# Patient Record
Sex: Male | Born: 1955 | Race: White | Hispanic: No | Smoking: Current every day smoker
Health system: Southern US, Community
[De-identification: ages and names within clinical notes are randomized; demographics above are authoritative.]

## PROBLEM LIST (undated history)

## (undated) DIAGNOSIS — K635 Polyp of colon: Secondary | ICD-10-CM

## (undated) DIAGNOSIS — Z720 Tobacco use: Secondary | ICD-10-CM

## (undated) HISTORY — DX: Tobacco use: Z72.0

## (undated) HISTORY — DX: Polyp of colon: K63.5

## (undated) HISTORY — PX: TONSILLECTOMY: SUR1361

---
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
## 1981-12-16 HISTORY — PX: VASECTOMY: SHX75

## 2012-09-16 ENCOUNTER — Encounter: Payer: Self-pay | Admitting: Family Medicine

## 2012-09-16 ENCOUNTER — Ambulatory Visit (INDEPENDENT_AMBULATORY_CARE_PROVIDER_SITE_OTHER): Payer: 59 | Admitting: Family Medicine

## 2012-09-16 VITALS — BP 150/90 | HR 72 | Temp 97.9°F | Ht 69.0 in | Wt 164.0 lb

## 2012-09-16 DIAGNOSIS — Z23 Encounter for immunization: Secondary | ICD-10-CM

## 2012-09-16 DIAGNOSIS — Z720 Tobacco use: Secondary | ICD-10-CM | POA: Insufficient documentation

## 2012-09-16 DIAGNOSIS — F17200 Nicotine dependence, unspecified, uncomplicated: Secondary | ICD-10-CM

## 2012-09-16 DIAGNOSIS — Z125 Encounter for screening for malignant neoplasm of prostate: Secondary | ICD-10-CM

## 2012-09-16 DIAGNOSIS — Z136 Encounter for screening for cardiovascular disorders: Secondary | ICD-10-CM

## 2012-09-16 DIAGNOSIS — R079 Chest pain, unspecified: Secondary | ICD-10-CM

## 2012-09-16 DIAGNOSIS — I10 Essential (primary) hypertension: Secondary | ICD-10-CM | POA: Insufficient documentation

## 2012-09-16 LAB — COMPREHENSIVE METABOLIC PANEL
ALT: 21 U/L (ref 0–53)
AST: 23 U/L (ref 0–37)
Albumin: 4.3 g/dL (ref 3.5–5.2)
Calcium: 9.5 mg/dL (ref 8.4–10.5)
Chloride: 105 mEq/L (ref 96–112)
Potassium: 3.9 mEq/L (ref 3.5–5.1)
Sodium: 140 mEq/L (ref 135–145)

## 2012-09-16 LAB — LIPID PANEL
HDL: 40.2 mg/dL (ref >39.00)
Total CHOL/HDL Ratio: 4

## 2012-09-16 LAB — PSA: PSA: 2.95 ng/mL (ref 0.10–4.00)

## 2012-09-16 MED ORDER — LISINOPRIL 10 MG PO TABS: 10.0000 mg | ORAL_TABLET | Freq: Every day | ORAL | Status: DC

## 2012-09-16 NOTE — Patient Instructions (Addendum)
It was so nice to meet you. We are starting lisinopril 10 mg daily. Please come see me in 2-3 weeks for a complete physical and follow up blood pressure.  We will call you with your lab results.

## 2012-09-16 NOTE — Addendum Note (Signed)
Addended by: Eliezer Bottom on: 09/16/2012 12:40 PM   Modules accepted: Orders

## 2012-09-16 NOTE — Progress Notes (Signed)
Subjective:    Patient ID: Derek Flowers, male    DOB: 1956-09-25, 56 y.o.   MRN: [id number]  HPI  56 yo male with h/o tobacco abuse here to establish care with chest pain.  Has not been to a physician since before he moved here from [place] 3 years ago.  Kennyth Arnold and Alona Bene Lease are his parents.  His father did have an MI in his 36s.  3 days ago, Mr. Kozar noticed at work that he was having left sided chest pain.  Pain has been intermittent and does not seem to get better or worse with exertion.  He has not noticed any gassiness, nausea, vomiting or diaphoresis.  He has been taking ASA daily since this started. No SOB or palpitations.  He is a smoker- smokes 1 ppd, sometimes more depending on stress level at work.  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  HTN- BP is elevated today and has been on his parents home BP cuff and at the dentist office, all readings in 150s/90s.  Never been treated for HTN.  Patient Active Problem List  Diagnosis  . Tobacco abuse  . HTN (hypertension)  . Chest pain   Past Medical History  Diagnosis Date  . Family history of early CAD   . Tobacco abuse    Past Surgical History  Procedure Date  . Tonsillectomy    History  Substance Use Topics  . Smoking status: Current Every Day Smoker  . Smokeless tobacco: Not on file  . Alcohol Use: Not on file   Family History  Problem Relation Age of Onset  . Cancer Mother   . Kidney disease Mother   . Cancer Father   . Kidney disease Father   . Heart disease Father    Allergies  Allergen Reactions  . Penicillins     As a child   No current outpatient prescriptions on file prior to visit.   The PMH, PSH, Social History, Family History, Medications, and allergies have been reviewed in [facility], and have been updated if relevant.   Review of Systems See HPI No palpitations No anxiety or depression No changes in bowels Appetite good    Objective:   Physical Exam BP 150/90  Pulse 72  Temp 97.9 F (36.6 C)  Ht 5\' 9"  (1.753 m)  Wt 164 lb (74.39 kg)  BMI 24.22 kg/m2 General:  Very pleasant but anxious male in NAD Eyes:  PERRL Ears:  External ear exam shows no significant lesions or deformities.  Otoscopic examination reveals clear canals, tympanic membranes are intact bilaterally without bulging, retraction, inflammation or discharge. Hearing is grossly normal bilaterally. Nose:  External nasal examination shows no deformity or inflammation. Nasal mucosa are pink and moist without lesions or exudates. Mouth:  Oral mucosa and oropharynx without lesions or exudates.  Teeth in good repair. Neck:  no carotid bruit or thyromegaly no cervical or supraclavicular lymphadenopathy  Lungs:  Normal respiratory effort, chest expands symmetrically. Lungs are clear to auscultation, no crackles or wheezes. Heart:  Normal rate and regular rhythm. S1 and S2 normal without gallop, murmur, click, rub or other extra sounds. Abdomen:  Bowel sounds positive,abdomen soft and non-tender without masses, organomegaly or hernias noted.or tibial pulses are full and equal bilaterally  Extremities:  no edema      Assessment & Plan:   1. HTN (hypertension)  New diagnosis with multiple elevated readings per pt. Will start lisinopril 10 mg daily. Follow  up in 2-3 weeks. Check CMET today. The patient indicates understanding of these issues and agrees with the plan.  Comprehensive metabolic panel  2. Chest pain  New- atypical. EKG reassuring- mild bradycardia, otherwise normal. Likely muscle strain from lifting at work. Will continue to risk statify with lipid panel. The patient indicates understanding of these issues and agrees with the plan.  EKG 12-Lead

## 2012-09-28 ENCOUNTER — Ambulatory Visit (INDEPENDENT_AMBULATORY_CARE_PROVIDER_SITE_OTHER): Payer: 59 | Admitting: Family Medicine

## 2012-09-28 ENCOUNTER — Encounter: Payer: Self-pay | Admitting: Family Medicine

## 2012-09-28 ENCOUNTER — Encounter: Payer: 59 | Admitting: Family Medicine

## 2012-09-28 VITALS — BP 128/85 | HR 68 | Temp 98.1°F | Ht 69.0 in | Wt 163.0 lb

## 2012-09-28 DIAGNOSIS — Z1159 Encounter for screening for other viral diseases: Secondary | ICD-10-CM

## 2012-09-28 DIAGNOSIS — I10 Essential (primary) hypertension: Secondary | ICD-10-CM

## 2012-09-28 DIAGNOSIS — Z720 Tobacco use: Secondary | ICD-10-CM

## 2012-09-28 DIAGNOSIS — Z23 Encounter for immunization: Secondary | ICD-10-CM

## 2012-09-28 DIAGNOSIS — Z1211 Encounter for screening for malignant neoplasm of colon: Secondary | ICD-10-CM

## 2012-09-28 DIAGNOSIS — H6120 Impacted cerumen, unspecified ear: Secondary | ICD-10-CM

## 2012-09-28 DIAGNOSIS — Z0000 Encounter for general adult medical examination without abnormal findings: Secondary | ICD-10-CM

## 2012-09-28 DIAGNOSIS — L989 Disorder of the skin and subcutaneous tissue, unspecified: Secondary | ICD-10-CM

## 2012-09-28 DIAGNOSIS — F17200 Nicotine dependence, unspecified, uncomplicated: Secondary | ICD-10-CM

## 2012-09-28 NOTE — Addendum Note (Signed)
Addended by: Eliezer Bottom on: 09/28/2012 11:26 AM   Modules accepted: Orders

## 2012-09-28 NOTE — Progress Notes (Signed)
Subjective:    Patient ID: Derek Flowers, male    DOB: March 30, 1956, 56 y.o.   MRN: [id number]  HPI  56 yo male here for CPX.  Established care with me on 10/2.    Had never been treated for HTB but BP was elevated and had been at home as well.   Started Lisinopril 10 mg daily. Denies any side effects. No CP or SOB.  Lab Results  Component Value Date   CHOL 143 09/16/2012   HDL 40.20 09/16/2012   LDLCALC 86 09/16/2012   TRIG 85.0 09/16/2012   CHOLHDL 4 09/16/2012   Lab Results  Component Value Date   CREATININE 1.0 09/16/2012    He is a smoker- smokes 1 ppd, sometimes more depending on stress level at work.  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  He did have a colonoscopy at 56 yo.  Denies any blood in his stool or changes in his bowel habits. No known family history of colon cancer.  Cerumen impaction- feels like right ear is stopped up. No ear pain or significant hearing loss.  Patient Active Problem List  Diagnosis  . Tobacco abuse  . HTN (hypertension)  . Chest pain  . Routine general medical examination at a health care facility   Past Medical History  Diagnosis Date  . Family history of early CAD   . Tobacco abuse    Past Surgical History  Procedure Date  . Tonsillectomy    History  Substance Use Topics  . Smoking status: Current Every Day Smoker  . Smokeless tobacco: Not on file  . Alcohol Use: Not on file   Family History  Problem Relation Age of Onset  . Cancer Mother   . Kidney disease Mother   . Cancer Father   . Kidney disease Father   . Heart disease Father    Allergies  Allergen Reactions  . Penicillins     As a child   Current Outpatient Prescriptions on File Prior to Visit  Medication Sig Dispense Refill  . aspirin 325 MG tablet Take 325 mg by mouth daily.      Marland Kitchen lisinopril (PRINIVIL,ZESTRIL) 10 MG tablet Take 1 tablet (10 mg total) by mouth daily.  90 tablet  3  . Multiple Vitamin  (MULTIVITAMIN) tablet Take 1 tablet by mouth daily.      . naproxen sodium (ANAPROX) 220 MG tablet Take 220 mg by mouth 2 (two) times daily with a meal.       The PMH, PSH, Social History, Family History, Medications, and allergies have been reviewed in [facility], and have been updated if relevant.   Review of Systems See HPI Patient reports no  vision/ hearing changes,anorexia, weight change, fever ,adenopathy, persistant / recurrent hoarseness, swallowing issues, chest pain, edema,persistant / recurrent cough, hemoptysis, dyspnea(rest, exertional, paroxysmal nocturnal), gastrointestinal  bleeding (melena, rectal bleeding), abdominal pain, excessive heart burn, GU symptoms(dysuria, hematuria, pyuria, voiding/incontinence  Issues) syncope, focal weakness, severe memory loss, concerning skin lesions, depression, anxiety, abnormal bruising/bleeding, major joint swelling.       Objective:   Physical Exam BP 128/85  Pulse 68  Temp 98.1 F (36.7 C)  Ht 5\' 9"  (1.753 m)  Wt 163 lb (73.936 kg)  BMI 24.07 kg/m2 General:  Very pleasant but anxious male in NAD Eyes:  PERRL Ears:  Ceruminosis is noted in right ear. Nose:  External nasal examination shows no deformity or inflammation. Nasal mucosa are pink and  moist without lesions or exudates. Mouth:  Oral mucosa and oropharynx without lesions or exudates.  Teeth in good repair. Neck:  no carotid bruit or thyromegaly no cervical or supraclavicular lymphadenopathy  Lungs:  Normal respiratory effort, chest expands symmetrically. Lungs are clear to auscultation, no crackles or wheezes. Heart:  Normal rate and regular rhythm. S1 and S2 normal without gallop, murmur, click, rub or other extra sounds. Abdomen:  Bowel sounds positive,abdomen soft and non-tender without masses, organomegaly or hernias noted.or tibial pulses are full and equal bilaterally  Extremities:  no edema  Skin:  Large pedunculated flesh color mass left inguinal area (probable skin tag),  right upper back subcutaneous, palpable mass (probable lipoma), Multiple AKS on legs and back.    Assessment & Plan:   1. Routine general medical examination at a health care facility  Reviewed preventive care protocols, scheduled due services, and updated immunizations Discussed nutrition, exercise, diet, and healthy lifestyle.  Will screen for hep c given previous tatoos. The patient indicates understanding of these issues and agrees with the plan.   2. Tobacco abuse  Not ready to quit.   3. HTN (hypertension)  Improved on current dose of Lisinopril.   4. Screening for colon cancer  Fecal occult blood, imunochemical  5. Skin lesions, generalized  Ambulatory referral to Dermatology  6. Cerumen impaction   Wax is removed by syringing and manual debridement. Instructions for home care to prevent wax buildup are given.

## 2012-09-28 NOTE — Patient Instructions (Addendum)
Great to see you. Please stop by to see Shirlee Limerick on your way out to set up your dermatology referral.  Health Maintenance, Males A healthy lifestyle and preventative care can promote health and wellness.  Maintain regular health, dental, and eye exams.  Eat a healthy diet. Foods like vegetables, fruits, whole grains, low-fat dairy products, and lean protein foods contain the nutrients you need without too many calories. Decrease your intake of foods high in solid fats, added sugars, and salt. Get information about a proper diet from your caregiver, if necessary.  Regular physical exercise is one of the most important things you can do for your health. Most adults should get at least 150 minutes of moderate-intensity exercise (any activity that increases your heart rate and causes you to sweat) each week. In addition, most adults need muscle-strengthening exercises on 2 or more days a week.   Maintain a healthy weight. The body mass index (BMI) is a screening tool to identify possible weight problems. It provides an estimate of body fat based on height and weight. Your caregiver can help determine your BMI, and can help you achieve or maintain a healthy weight. For adults 20 years and older:  A BMI below 18.5 is considered underweight.  A BMI of 18.5 to 24.9 is normal.  A BMI of 25 to 29.9 is considered overweight.  A BMI of 30 and above is considered obese.  Maintain normal blood lipids and cholesterol by exercising and minimizing your intake of saturated fat. Eat a balanced diet with plenty of fruits and vegetables. Blood tests for lipids and cholesterol should begin at age 93 and be repeated every 5 years. If your lipid or cholesterol levels are high, you are over 50, or you are a high risk for heart disease, you may need your cholesterol levels checked more frequently.Ongoing high lipid and cholesterol levels should be treated with medicines, if diet and exercise are not effective.  If you  smoke, find out from your caregiver how to quit. If you do not use tobacco, do not start.  If you choose to drink alcohol, do not exceed 2 drinks per day. One drink is considered to be 12 ounces (355 mL) of beer, 5 ounces (148 mL) of wine, or 1.5 ounces (44 mL) of liquor.  Avoid use of street drugs. Do not share needles with anyone. Ask for help if you need support or instructions about stopping the use of drugs.  High blood pressure causes heart disease and increases the risk of stroke. Blood pressure should be checked at least every 1 to 2 years. Ongoing high blood pressure should be treated with medicines if weight loss and exercise are not effective.  If you are 59 to 55 years old, ask your caregiver if you should take aspirin to prevent heart disease.  Diabetes screening involves taking a blood sample to check your fasting blood sugar level. This should be done once every 3 years, after age 69, if you are within normal weight and without risk factors for diabetes. Testing should be considered at a younger age or be carried out more frequently if you are overweight and have at least 1 risk factor for diabetes.  Colorectal cancer can be detected and often prevented. Most routine colorectal cancer screening begins at the age of 73 and continues through age 53. However, your caregiver may recommend screening at an earlier age if you have risk factors for colon cancer. On a yearly basis, your caregiver may provide home  test kits to check for hidden blood in the stool. Use of a small camera at the end of a tube, to directly examine the colon (sigmoidoscopy or colonoscopy), can detect the earliest forms of colorectal cancer. Talk to your caregiver about this at age 64, when routine screening begins. Direct examination of the colon should be repeated every 5 to 10 years through age 8, unless early forms of pre-cancerous polyps or small growths are found.  Hepatitis C blood testing is recommended for all  people born from [birth year] through 1965 and any individual with known risks for hepatitis C.  Healthy men should no longer receive prostate-specific antigen (PSA) blood tests as part of routine cancer screening. Consult with your caregiver about prostate cancer screening.  Testicular cancer screening is not recommended for adolescents or adult males who have no symptoms. Screening includes self-exam, caregiver exam, and other screening tests. Consult with your caregiver about any symptoms you have or any concerns you have about testicular cancer.  Practice safe sex. Use condoms and avoid high-risk sexual practices to reduce the spread of sexually transmitted infections (STIs).  Use sunscreen with a sun protection factor (SPF) of 30 or greater. Apply sunscreen liberally and repeatedly throughout the day. You should seek shade when your shadow is shorter than you. Protect yourself by wearing long sleeves, pants, a wide-brimmed hat, and sunglasses year round, whenever you are outdoors.  Notify your caregiver of new moles or changes in moles, especially if there is a change in shape or color. Also notify your caregiver if a mole is larger than the size of a pencil eraser.  A one-time screening for abdominal aortic aneurysm (AAA) and surgical repair of large AAAs by sound wave imaging (ultrasonography) is recommended for ages 20 to 36 years who are current or former smokers.  Stay current with your immunizations. Document Released: 05/30/2008 Document Revised: 02/24/2012 Document Reviewed: 04/29/2011 [facility] Patient Information 2013 [place], [place].

## 2012-09-29 LAB — HEPATITIS C ANTIBODY: HCV Ab: NEGATIVE

## 2012-10-05 ENCOUNTER — Telehealth: Payer: Self-pay | Admitting: Radiology

## 2012-10-05 ENCOUNTER — Encounter: Payer: Self-pay | Admitting: Family Medicine

## 2012-10-05 ENCOUNTER — Other Ambulatory Visit: Payer: 59

## 2012-10-05 LAB — FECAL OCCULT BLOOD, IMMUNOCHEMICAL: Fecal Occult Bld: POSITIVE

## 2012-10-05 NOTE — Telephone Encounter (Signed)
[facility] called a positive ifob.Results given to Dr Dayton Martes

## 2012-10-06 ENCOUNTER — Other Ambulatory Visit: Payer: Self-pay | Admitting: Family Medicine

## 2012-10-06 ENCOUNTER — Telehealth: Payer: Self-pay | Admitting: *Deleted

## 2012-10-06 DIAGNOSIS — R195 Other fecal abnormalities: Secondary | ICD-10-CM

## 2012-10-06 DIAGNOSIS — Z86010 Personal history of colonic polyps: Secondary | ICD-10-CM

## 2012-10-06 NOTE — Telephone Encounter (Signed)
See result note.  

## 2012-10-07 ENCOUNTER — Encounter: Payer: Self-pay | Admitting: Internal Medicine

## 2012-10-29 ENCOUNTER — Encounter: Payer: Self-pay | Admitting: Internal Medicine

## 2012-10-30 ENCOUNTER — Encounter: Payer: Self-pay | Admitting: Internal Medicine

## 2012-10-30 ENCOUNTER — Ambulatory Visit (INDEPENDENT_AMBULATORY_CARE_PROVIDER_SITE_OTHER): Payer: 59 | Admitting: Internal Medicine

## 2012-10-30 ENCOUNTER — Other Ambulatory Visit: Payer: Self-pay | Admitting: Gastroenterology

## 2012-10-30 VITALS — BP 144/84 | HR 66 | Ht 70.0 in | Wt 172.0 lb

## 2012-10-30 DIAGNOSIS — K1379 Other lesions of oral mucosa: Secondary | ICD-10-CM

## 2012-10-30 DIAGNOSIS — R195 Other fecal abnormalities: Secondary | ICD-10-CM

## 2012-10-30 DIAGNOSIS — K1370 Unspecified lesions of oral mucosa: Secondary | ICD-10-CM

## 2012-10-30 NOTE — Patient Instructions (Addendum)
You have been given a separate informational sheet regarding your tobacco use, the importance of quitting and local resources to help you quit.   You have been scheduled for a colonoscopy with propofol. Please follow written instructions given to you at your visit today.  Please pick up your prep kit at the pharmacy within the next 1-3 days. If you use inhalers (even only as needed) or a CPAP machine, please bring them with you on the day of your procedure.  

## 2012-10-30 NOTE — Progress Notes (Signed)
Patient ID: Derek Flowers, male   DOB: Jun 20, 1956, 56 y.o.   MRN: [id number]  SUBJECTIVE: HPI [name] is a 56 year old male with a past medical history of hypertension, tobacco use who seen in consultation at the request of Dr. Dayton Martes for positive FOBT.  The patient had screening colonoscopy at age 70 performed in [place], and recalls polyps been removed. He is unaware of the type of polyp or the recommended surveillance interval. Currently he is without GI complaint. He said no change in his bowel pattern, no abdominal pain, diarrhea or constipation. He does has a history of hemorrhoids, but they have not been an issue of late. No rectal bleeding or melena. No weight loss. Normal appetite and no weight loss. He does have a lesion on the inside of his left buccal mucosa which is nonhealing, but not painful. He reports his dentist saw the lesion prescribed a mouth rinse which did not help. He is a smoker long-standing, but has not used smokeless tobacco.  Review of Systems  As per history of present illness, otherwise negative   Past Medical History  Diagnosis Date  . Tobacco abuse     Current Outpatient Prescriptions  Medication Sig Dispense Refill  . aspirin 325 MG tablet Take 325 mg by mouth daily.      Marland Kitchen lisinopril (PRINIVIL,ZESTRIL) 10 MG tablet Take 1 tablet (10 mg total) by mouth daily.  90 tablet  3  . Multiple Vitamin (MULTIVITAMIN) tablet Take 1 tablet by mouth daily.      . naproxen sodium (ANAPROX) 220 MG tablet Take 220 mg by mouth 2 (two) times daily with a meal.        Allergies  Allergen Reactions  . Penicillins     As a child    Family History  Problem Relation Age of Onset  . Cancer Mother   . Kidney disease Mother   . Cancer Father   . Kidney disease Father   . Heart disease Father   . Colon polyps Father     History  Substance Use Topics  . Smoking status: Current Every Day Smoker -- 0.5 packs/day for 40 years    Types: Cigarettes  . Smokeless tobacco:  Never Used  . Alcohol Use: No    OBJECTIVE: BP 144/84  Pulse 66  Ht 5\' 10"  (1.778 m)  Wt 172 lb (78.019 kg)  BMI 24.68 kg/m2 Constitutional: Well-developed and well-nourished. No distress. HEENT: Normocephalic and atraumatic. Left buccal mucosa lesion with fissuring and whitish coloration. No oropharyngeal exudate. Conjunctivae are normal. No scleral icterus. Neck: Neck supple. Trachea midline. Cardiovascular: Normal rate, regular rhythm and intact distal pulses. No M/R/G Pulmonary/chest: Effort normal and breath sounds normal. No wheezing, rales or rhonchi. Abdominal: Soft, nontender, nondistended. Bowel sounds active throughout. There are no masses palpable. No hepatosplenomegaly. Extremities: no clubbing, cyanosis, or edema Lymphadenopathy: No cervical adenopathy noted. Neurological: Alert and oriented to person place and time. Skin: Skin is warm and dry. No rashes noted. Psychiatric: Normal mood and affect. Behavior is normal.  Labs CMP     Component Value Date/Time   NA 140 09/16/2012 1149   K 3.9 09/16/2012 1149   CL 105 09/16/2012 1149   CO2 28 09/16/2012 1149   GLUCOSE 95 09/16/2012 1149   BUN 16 09/16/2012 1149   CREATININE 1.0 09/16/2012 1149   CALCIUM 9.5 09/16/2012 1149   PROT 6.7 09/16/2012 1149   ALBUMIN 4.3 09/16/2012 1149   AST 23 09/16/2012 1149   ALT 21 09/16/2012  1149   ALKPHOS 77 09/16/2012 1149   BILITOT 1.1 09/16/2012 1149   +FOBT  ASSESSMENT AND PLAN: 56 year old male with a past medical history of hypertension, tobacco use who seen in consultation at the request of Dr. Dayton Martes for positive FOBT.  1.  + FOBT -- we will request the patient's colonoscopy records from his initial screening examination. Regardless of those results, given his positive FOBT I recommended repeat colonoscopy. We discussed the test and he is agreeable to proceed.  Surveillance interval we based on this examination and review of the records of his first colonoscopy..  2.  Buccal mucosal  lesion -- I think this deserves further investigation and will refer him to ENT for evaluation and consideration of biopsy if felt necessary (especially in light of his tobacco use)

## 2012-11-02 ENCOUNTER — Telehealth: Payer: Self-pay | Admitting: Gastroenterology

## 2012-11-02 NOTE — Telephone Encounter (Signed)
Called pt. lvm telling him he has been referred to ENT Dr. Annalee Genta at [facility] [phone] on 11/16/2012 @ 1:40, he is to arrive 30 min. Prior to his appointment bring a list of his medications and co-pay. I gave him the address and phone # in case he needed to reschedule.

## 2012-11-09 ENCOUNTER — Other Ambulatory Visit: Payer: Self-pay | Admitting: Gastroenterology

## 2012-11-09 MED ORDER — PEG-KCL-NACL-NASULF-NA ASC-C 100 G PO SOLR: 1.0000 | Freq: Once | ORAL | Status: DC

## 2012-11-10 ENCOUNTER — Ambulatory Visit: Payer: Self-pay | Admitting: Family Medicine

## 2012-11-10 NOTE — Telephone Encounter (Signed)
Opened in error

## 2012-11-15 DIAGNOSIS — K635 Polyp of colon: Secondary | ICD-10-CM

## 2012-11-15 HISTORY — DX: Polyp of colon: K63.5

## 2012-11-24 ENCOUNTER — Encounter: Payer: 59 | Admitting: Internal Medicine

## 2012-11-26 ENCOUNTER — Ambulatory Visit (AMBULATORY_SURGERY_CENTER): Payer: 59 | Admitting: Internal Medicine

## 2012-11-26 ENCOUNTER — Encounter: Payer: Self-pay | Admitting: Internal Medicine

## 2012-11-26 VITALS — BP 116/71 | HR 56 | Temp 97.1°F | Resp 26 | Ht 70.0 in | Wt 172.0 lb

## 2012-11-26 DIAGNOSIS — Z1211 Encounter for screening for malignant neoplasm of colon: Secondary | ICD-10-CM

## 2012-11-26 DIAGNOSIS — R195 Other fecal abnormalities: Secondary | ICD-10-CM

## 2012-11-26 DIAGNOSIS — Z86010 Personal history of colonic polyps: Secondary | ICD-10-CM

## 2012-11-26 DIAGNOSIS — D126 Benign neoplasm of colon, unspecified: Secondary | ICD-10-CM

## 2012-11-26 MED ORDER — SODIUM CHLORIDE 0.9 % IV SOLN: 500.0000 mL | INTRAVENOUS | Status: DC

## 2012-11-26 NOTE — Progress Notes (Signed)
Patient did not have preoperative order for IV antibiotic SSI prophylaxis. ([id number])   

## 2012-11-26 NOTE — Progress Notes (Signed)
Called to room to assist during endoscopic procedure.  Patient ID and intended procedure confirmed with present staff. Received instructions for my participation in the procedure from the performing physician. [email or web address] 

## 2012-11-26 NOTE — Patient Instructions (Addendum)
YOU HAD AN ENDOSCOPIC PROCEDURE TODAY AT THE [facility]: Refer to the procedure report that was given to you for any specific questions about what was found during the examination.  If the procedure report does not answer your questions, please call your gastroenterologist to clarify.  If you requested that your care partner not be given the details of your procedure findings, then the procedure report has been included in a sealed envelope for you to review at your convenience later.  YOU SHOULD EXPECT: Some feelings of bloating in the abdomen. Passage of more gas than usual.  Walking can help get rid of the air that was put into your GI tract during the procedure and reduce the bloating. If you had a lower endoscopy (such as a colonoscopy or flexible sigmoidoscopy) you may notice spotting of blood in your stool or on the toilet paper. If you underwent a bowel prep for your procedure, then you may not have a normal bowel movement for a few days.  DIET: Your first meal following the procedure should be a light meal and then it is ok to progress to your normal diet.  A half-sandwich or bowl of soup is an example of a good first meal.  Heavy or fried foods are harder to digest and may make you feel nauseous or bloated.  Likewise meals heavy in dairy and vegetables can cause extra gas to form and this can also increase the bloating.  Drink plenty of fluids but you should avoid alcoholic beverages for 24 hours.  ACTIVITY: Your care partner should take you home directly after the procedure.  You should plan to take it easy, moving slowly for the rest of the day.  You can resume normal activity the day after the procedure however you should NOT DRIVE or use heavy machinery for 24 hours (because of the sedation medicines used during the test).    SYMPTOMS TO REPORT IMMEDIATELY: A gastroenterologist can be reached at any hour.  During normal business hours, 8:30 AM to 5:00 PM Monday through Friday,  call [phone].  After hours and on weekends, please call the GI answering service at [phone] who will take a message and have the physician on call contact you.   Following lower endoscopy (colonoscopy or flexible sigmoidoscopy):  Excessive amounts of blood in the stool  Significant tenderness or worsening of abdominal pains  Swelling of the abdomen that is new, acute  Fever of 100F or higher  FOLLOW UP: If any biopsies were taken you will be contacted by phone or by letter within the next 1-3 weeks.  Call your gastroenterologist if you have not heard about the biopsies in 3 weeks.  Our staff will call the home number listed on your records the next business day following your procedure to check on you and address any questions or concerns that you may have at that time regarding the information given to you following your procedure. This is a courtesy call and so if there is no answer at the home number and we have not heard from you through the emergency physician on call, we will assume that you have returned to your regular daily activities without incident.  SIGNATURES/CONFIDENTIALITY: You and/or your care partner have signed paperwork which will be entered into your electronic medical record.  These signatures attest to the fact that that the information above on your After Visit Summary has been reviewed and is understood.  Full responsibility of the confidentiality of this   discharge information lies with you and/or your care-partner.   Thank-you for choosing us for your healthcare needs. 

## 2012-11-26 NOTE — Op Note (Signed)
[facility] [address] [place], [address]   COLONOSCOPY PROCEDURE REPORT  PATIENT: Derek, Flowers  MR#: [id number] BIRTHDATE: [birth year] , 56  yrs. old GENDER: Male ENDOSCOPIST: Beverley Fiedler, MD REFERRED XB:JYNW, Talia PROCEDURE DATE:  11/26/2012 PROCEDURE:   Colonoscopy with biopsy and Colonoscopy with cold biopsy polypectomy ASA CLASS:   Class II INDICATIONS:heme-positive stool. MEDICATIONS: MAC sedation, administered by CRNA and Propofol (Diprivan) 170 mg IV  DESCRIPTION OF PROCEDURE:   After the risks benefits and alternatives of the procedure were thoroughly explained, informed consent was obtained.  A digital rectal exam revealed no rectal mass.   The LB CF-[id number]  endoscope was introduced through the anus and advanced to the terminal ileum which was intubated for a short distance. No adverse events experienced.   The quality of the prep was good, using MoviPrep  The instrument was then slowly withdrawn as the colon was fully examined.   COLON FINDINGS: The mucosa appeared normal in the terminal ileum. Two sessile polyps measuring 2-3 mm in size were found in the sigmoid colon and rectum.  Polypectomy was performed with cold forceps.  All resections were complete and all polyp tissue was completely retrieved.   Mild patchy erythema was found in the sigmoid colon in the segment of diverticulosis.  Query preparation artifact verus very mild segment colitis associated with diverticulosis.  Multiple biopsies of the area were performed using cold forceps.   There was moderate diverticulosis noted in the descending colon and sigmoid colon with associated muscular hypertrophy.  Retroflexed views revealed small internal hemorrhoids. The time to cecum=4 minutes 50 seconds.  Withdrawal time=13 minutes 28 seconds.  The scope was withdrawn and the procedure completed. COMPLICATIONS: There were no complications.  ENDOSCOPIC IMPRESSION: 1.   Normal  mucosa in the terminal ileum 2.   Two sessile polyps measuring 2-3 mm in size were found in the sigmoid colon and rectum; Polypectomy was performed with cold forceps 3.   Mild erythema was found in the sigmoid colon; multiple biopsies of the area were performed using cold forceps 4.   There was moderate diverticulosis noted in the descending colon and sigmoid colon 5.   Small internal hemorrhoids  RECOMMENDATIONS: 1.  Await pathology results 2.  High fiber diet 3.  Timing of repeat colonoscopy will be determined by pathology findings. 4.  You will receive a letter within 1-2 weeks with the results of your biopsy as well as final recommendations.  Please call my office if you have not received a letter after 3 weeks.   eSigned:  Beverley Fiedler, MD 11/26/2012 2:36 PM   cc: Ruthe Mannan MD and The Patient   PATIENT NAME:  Derek, Flowers MR#: [id number]

## 2012-11-27 ENCOUNTER — Telehealth: Payer: Self-pay | Admitting: *Deleted

## 2012-11-27 NOTE — Telephone Encounter (Signed)
  Follow up Call-  Call back number 11/26/2012  Post procedure Call Back phone  # [phone]  Permission to leave phone message Yes     Patient questions:  Do you have a fever, pain , or abdominal swelling? no Pain Score  0 *  Have you tolerated food without any problems? yes  Have you been able to return to your normal activities? yes  Do you have any questions about your discharge instructions: Diet   no Medications  no Follow up visit  no  Do you have questions or concerns about your Care? no  Actions: * If pain score is 4 or above: No action needed, pain <4.

## 2012-12-02 ENCOUNTER — Encounter: Payer: Self-pay | Admitting: Internal Medicine

## 2012-12-30 ENCOUNTER — Encounter: Payer: Self-pay | Admitting: Family Medicine

## 2013-01-06 ENCOUNTER — Encounter: Payer: Self-pay | Admitting: Family Medicine

## 2013-01-06 ENCOUNTER — Ambulatory Visit (INDEPENDENT_AMBULATORY_CARE_PROVIDER_SITE_OTHER): Payer: 59 | Admitting: Family Medicine

## 2013-01-06 VITALS — BP 130/80 | HR 76 | Temp 98.0°F | Wt 173.0 lb

## 2013-01-06 DIAGNOSIS — N529 Male erectile dysfunction, unspecified: Secondary | ICD-10-CM | POA: Insufficient documentation

## 2013-01-06 MED ORDER — SILDENAFIL CITRATE 100 MG PO TABS: 50.0000 mg | ORAL_TABLET | Freq: Every day | ORAL | Status: AC | PRN

## 2013-01-06 NOTE — Progress Notes (Signed)
  Subjective:    Patient ID: Derek Flowers, male    DOB: 1956-09-26, 57 y.o.   MRN: [id number]  HPI 57 yo here to discuss tx options for erectile dysfunction.  He is able to get an erection on his own and not having any difficulty reaching orgasm. His girlfriend livers in [place] and he only sees her twice a year. He is seeing her at the end of this month and having anxiety about trouble performing when he does see her.  Has never taken anything for ED in past.  No CP or SOB.  Patient Active Problem List  Diagnosis  . Tobacco abuse  . HTN (hypertension)  . Erectile dysfunction   Past Medical History  Diagnosis Date  . Tobacco abuse    Past Surgical History  Procedure Date  . Tonsillectomy   . Hernia repair 1972  . Vasectomy 1983   History  Substance Use Topics  . Smoking status: Current Every Day Smoker -- 0.5 packs/day for 40 years    Types: Cigarettes  . Smokeless tobacco: Never Used  . Alcohol Use: No   Family History  Problem Relation Age of Onset  . Cancer Mother   . Kidney disease Mother   . Cancer Father   . Kidney disease Father   . Heart disease Father   . Colon polyps Father    Allergies  Allergen Reactions  . Penicillins     As a child   Current Outpatient Prescriptions on File Prior to Visit  Medication Sig Dispense Refill  . aspirin 325 MG tablet Take 325 mg by mouth daily.      Marland Kitchen lisinopril (PRINIVIL,ZESTRIL) 10 MG tablet Take 1 tablet (10 mg total) by mouth daily.  90 tablet  3  . Multiple Vitamin (MULTIVITAMIN) tablet Take 1 tablet by mouth daily.      . naproxen sodium (ANAPROX) 220 MG tablet Take 220 mg by mouth 2 (two) times daily with a meal.      . sildenafil (VIAGRA) 100 MG tablet Take 0.5-1 tablets (50-100 mg total) by mouth daily as needed for erectile dysfunction.  10 tablet  11   The PMH, PSH, Social History, Family History, Medications, and allergies have been reviewed in [facility], and have been updated if relevant.    Review of  Systems See HPI    Objective:   Physical Exam BP 130/80  Pulse 76  Temp 98 F (36.7 C)  Wt 173 lb (78.472 kg) Gen:  Alert pleasant, NAD Psych:  Good eye contact, not anxious or depressed appearing.    Assessment & Plan:   1. Erectile dysfunction    >15 min spent with face to face with patient, >50% counseling and/or coordinating care.  Given rx for viagra to use prn-discussed possible side effects and precautions including priapism. He will email me or call me with an update.

## 2013-01-06 NOTE — Patient Instructions (Addendum)
Great to see you. Have a wonderful time with your girlfriend.

## 2013-08-30 ENCOUNTER — Encounter: Payer: Self-pay | Admitting: Internal Medicine

## 2013-08-30 ENCOUNTER — Other Ambulatory Visit (INDEPENDENT_AMBULATORY_CARE_PROVIDER_SITE_OTHER): Payer: 59

## 2013-08-30 ENCOUNTER — Ambulatory Visit (INDEPENDENT_AMBULATORY_CARE_PROVIDER_SITE_OTHER): Payer: 59 | Admitting: Internal Medicine

## 2013-08-30 VITALS — BP 108/70 | HR 80 | Ht 70.0 in | Wt 163.2 lb

## 2013-08-30 DIAGNOSIS — R1030 Lower abdominal pain, unspecified: Secondary | ICD-10-CM

## 2013-08-30 DIAGNOSIS — R109 Unspecified abdominal pain: Secondary | ICD-10-CM

## 2013-08-30 DIAGNOSIS — R1032 Left lower quadrant pain: Secondary | ICD-10-CM

## 2013-08-30 DIAGNOSIS — Z860100 Personal history of colon polyps, unspecified: Secondary | ICD-10-CM | POA: Insufficient documentation

## 2013-08-30 DIAGNOSIS — N509 Disorder of male genital organs, unspecified: Secondary | ICD-10-CM

## 2013-08-30 LAB — CBC
HCT: 43.8 % (ref 39.0–52.0)
MCHC: 34.1 g/dL (ref 30.0–36.0)
MCV: 92.7 fl (ref 78.0–100.0)
RDW: 13.3 % (ref 11.5–14.6)

## 2013-08-30 LAB — COMPREHENSIVE METABOLIC PANEL
AST: 19 U/L (ref 0–37)
Albumin: 4.2 g/dL (ref 3.5–5.2)
Alkaline Phosphatase: 63 U/L (ref 39–117)
Glucose, Bld: 88 mg/dL (ref 70–99)
Potassium: 4.1 mEq/L (ref 3.5–5.1)
Sodium: 139 mEq/L (ref 135–145)
Total Bilirubin: 0.5 mg/dL (ref 0.3–1.2)
Total Protein: 7 g/dL (ref 6.0–8.3)

## 2013-08-30 LAB — URINALYSIS WITH CULTURE, IF INDICATED
Bilirubin Urine: NEGATIVE
Nitrite: NEGATIVE
Total Protein, Urine: NEGATIVE
Urine Glucose: NEGATIVE
WBC, UA: NONE SEEN (ref 0–?)
pH: 7.5 (ref 5.0–8.0)

## 2013-08-30 NOTE — Patient Instructions (Addendum)
Your physician has requested that you go to the basement for the following lab work before leaving today: CBC, CMET, U/A   You have been scheduled for a CT scan of the abdomen and pelvis at [facility] ([address]---this is in the same building as Architectural technologist).   You are scheduled on 08/31/2013 at 9:30pm. You should arrive 15 minutes prior to your appointment time for registration. Please follow the written instructions below on the day of your exam:  WARNING: IF YOU ARE ALLERGIC TO IODINE/X-RAY DYE, PLEASE NOTIFY RADIOLOGY IMMEDIATELY AT [phone]! YOU WILL BE GIVEN A 13 HOUR PREMEDICATION PREP.  1) Do not eat or drink anything after 11:00am (4 hours prior to your test) 2) You have been given 2 bottles of oral contrast to drink. The solution may taste better if refrigerated, but do NOT add ice or any other liquid to this solution. Shake well before drinking.    Drink 1 bottle of contrast @7 :30 (2 hours prior to your exam)  Drink 1 bottle of contrast @ 8:30 (1 hour prior to your exam)  You may take any medications as prescribed with a small amount of water except for the following: Metformin, Glucophage, Glucovance, Avandamet, Riomet, Fortamet, Actoplus Met, Janumet, Glumetza or Metaglip. The above medications must be held the day of the exam AND 48 hours after the exam.  The purpose of you drinking the oral contrast is to aid in the visualization of your intestinal tract. The contrast solution may cause some diarrhea. Before your exam is started, you will be given a small amount of fluid to drink. Depending on your individual set of symptoms, you may also receive an intravenous injection of x-ray contrast/dye. Plan on being at [facility] for 30 minutes or long, depending on the type of exam you are having performed.  If you have any questions regarding your exam or if you need to reschedule, you may call the CT department at [phone] between the hours of  8:00 am and 5:00 pm, Monday-Friday.  You have been referred to [facility]. You will be contacted by them within the next 24 hours If you do not hear from them you can call [phone]    ________________________________________________________________________

## 2013-08-30 NOTE — Progress Notes (Signed)
Subjective:    Patient ID: Derek Flowers, male    DOB: 02/27/1956, 57 y.o.   MRN: [id number]  HPI Derek Flowers is a 57 year old male with a past medical history of hypertension, tobacco use, colonic polyps and diverticulosis who seen in urgent visit for left lower abdominal pain.  He is here alone today.  The patient reports developing left lower quadrant abdominal pain and pressure radiating towards his left groin late Friday night and into Saturday (2-3 days ago).  He reports that he was doing particularly strenuous work on Friday and initially felt like maybe this was the source of his pain. He reports developing left lower quadrant abdominal pain which radiated towards his left groin and reached 8/10 in intensity.  He did not seek emergency care and the pain slowly improved but has not completely remitted. It is now approximately 1/10. He has noted some mild constipation, but no rectal bleeding or melena. Appetite has been good and he was able to eat a full meal last night including steak, salad and baked potato. He normally has one bowel movement daily but did not have a bowel movement on Saturday or Sunday. He took one dose of MiraLax over the weekend. He's noted some urinary hesitancy and urgency, but thinks this might have been going on longer than very recently. He did take some ibuprofen for this pain and he feels it may have helped. Good appetite, no nausea, vomiting. No fevers or chills. He had a normal bowel movement this morning.  He denies penile discharge.  No testicular or scrotum pain today.  He also reports a soft left testicular "mass" which has been present for some time but never completely evaluated. He denies previous urology consult.  Review of Systems As per history of present illness, otherwise negative  Current Medications, Allergies, Past Medical History, Past Surgical History, Family History and Social History were reviewed in [employer] record.      Objective:   Physical Exam BP 108/70  Pulse 80  Ht 5\' 10"  (1.778 m)  Wt 163 lb 4 oz (74.05 kg)  BMI 23.42 kg/m2 Constitutional: Well-developed and well-nourished. No distress. HEENT: Normocephalic and atraumatic. Oropharynx is clear and moist. No oropharyngeal exudate. Conjunctivae are normal.  No scleral icterus. Neck: Neck supple. Trachea midline. Cardiovascular: Normal rate, regular rhythm and intact distal pulses. No M/R/G Pulmonary/chest: Effort normal and breath sounds normal. No wheezing, rales or rhonchi. Abdominal: Soft, very mild left lower quadrant and suprapubic pain to deep palpation, nondistended. Bowel sounds active throughout. There are no masses palpable. No hepatosplenomegaly. Extremities: no clubbing, cyanosis, or edema Lymphadenopathy: No cervical adenopathy noted. Neurological: Alert and oriented to person place and time. Skin: Skin is warm and dry. No rashes noted. Psychiatric: Normal mood and affect. Behavior is normal.      Assessment & Plan:  57 year old male with a past medical history of hypertension, tobacco use, colonic polyps and diverticulosis who seen in urgent visit for left lower abdominal pain.   1.  LLQ abd pain radiating to right groin -- exact etiology of this pain is unclear. Differential includes renal stone, lumbar disc disease, muscle strain, hernia, and less likely diverticulitis. His exam today is much more benign than I would expect for diverticulitis going on 2-3 days.  He is also not had fevers and is eating full meals without exacerbating symptoms.  Had normal bowel movement this morning which is reassuring. I recommend CBC, CMP, and UA with culture today.  I also  will order a noncontrast CT scan of the abdomen and pelvis to rule out renal stone.  I will not start antibiotics at this time but have asked that he notify me should his pain worsen again or change in any way. He voices understanding.  2.  Testicular "mass" -- he reports that this  has been present for one to 2 years but never fully evaluated. I recommended referral to urology which he agrees with.

## 2013-08-31 ENCOUNTER — Ambulatory Visit (INDEPENDENT_AMBULATORY_CARE_PROVIDER_SITE_OTHER)
Admission: RE | Admit: 2013-08-31 | Discharge: 2013-08-31 | Disposition: A | Discharge status: Home / Self Care | Payer: 59 | Source: Ambulatory Visit | Attending: Internal Medicine | Admitting: Internal Medicine

## 2013-08-31 ENCOUNTER — Telehealth: Payer: Self-pay | Admitting: Gastroenterology

## 2013-08-31 DIAGNOSIS — R1030 Lower abdominal pain, unspecified: Secondary | ICD-10-CM

## 2013-08-31 DIAGNOSIS — R109 Unspecified abdominal pain: Secondary | ICD-10-CM

## 2013-08-31 DIAGNOSIS — R1032 Left lower quadrant pain: Secondary | ICD-10-CM

## 2013-08-31 NOTE — Telephone Encounter (Signed)
Message copied by Richardo Hanks on Tue Aug 31, 2013  9:27 AM ------      Message from: Beverley Fiedler      Created: Tue Aug 31, 2013  8:38 AM       Please let pt know CBC is normal, metabolic and liver panel normal      Urine shows some red blood cells, further raising suspicion of kidney stone (CT is pending).  This is also another reason for urology referral ------

## 2013-08-31 NOTE — Telephone Encounter (Signed)
Spoke to pt gave him lab results per Dr. Rhea Belton, pt said he is getting his CT now, and has already spoken to Urology and Asher Muir the triage nurse will be calling him back with an appointment.

## 2013-09-02 ENCOUNTER — Inpatient Hospital Stay: Admission: RE | Admit: 2013-09-02 | Payer: 59 | Source: Ambulatory Visit

## 2013-10-06 ENCOUNTER — Emergency Department: Payer: Self-pay | Admitting: Emergency Medicine

## 2013-10-06 LAB — CBC WITH DIFFERENTIAL/PLATELET
Eosinophil %: 1.3 %
Lymphocyte #: 1.7 10*3/uL (ref 1.0–3.6)
Lymphocyte %: 16.6 %
Monocyte %: 10 %
RBC: 4.72 10*6/uL (ref 4.40–5.90)
RDW: 12.9 % (ref 11.5–14.5)

## 2013-10-06 LAB — BASIC METABOLIC PANEL
Anion Gap: 4 — ABNORMAL LOW (ref 7–16)
Calcium, Total: 9.3 mg/dL (ref 8.5–10.1)
Chloride: 109 mmol/L — ABNORMAL HIGH (ref 98–107)
Co2: 26 mmol/L (ref 21–32)
EGFR (African American): >60
EGFR (Non-African Amer.): >60
Sodium: 139 mmol/L (ref 136–145)

## 2013-10-06 LAB — APTT: Activated PTT: 29.6 secs (ref 23.6–35.9)

## 2013-10-09 ENCOUNTER — Other Ambulatory Visit: Payer: Self-pay | Admitting: Family Medicine

## 2013-10-12 ENCOUNTER — Ambulatory Visit: Payer: Self-pay | Admitting: Orthopedic Surgery

## 2013-10-21 ENCOUNTER — Other Ambulatory Visit: Payer: Self-pay

## 2013-11-10 ENCOUNTER — Other Ambulatory Visit: Payer: Self-pay | Admitting: Family Medicine

## 2013-12-12 ENCOUNTER — Other Ambulatory Visit: Payer: Self-pay | Admitting: Family Medicine

## 2013-12-13 ENCOUNTER — Other Ambulatory Visit: Payer: Self-pay | Admitting: Internal Medicine

## 2013-12-13 DIAGNOSIS — Z0000 Encounter for general adult medical examination without abnormal findings: Secondary | ICD-10-CM

## 2013-12-20 ENCOUNTER — Other Ambulatory Visit (INDEPENDENT_AMBULATORY_CARE_PROVIDER_SITE_OTHER): Payer: 59

## 2013-12-20 DIAGNOSIS — Z0000 Encounter for general adult medical examination without abnormal findings: Secondary | ICD-10-CM

## 2013-12-20 DIAGNOSIS — I10 Essential (primary) hypertension: Secondary | ICD-10-CM

## 2013-12-20 LAB — LIPID PANEL
CHOLESTEROL: 161 mg/dL (ref 0–200)
HDL: 37.3 mg/dL — ABNORMAL LOW (ref >39.00)
LDL Cholesterol: 98 mg/dL (ref 0–99)
Total CHOL/HDL Ratio: 4
Triglycerides: 131 mg/dL (ref 0.0–149.0)
VLDL: 26.2 mg/dL (ref 0.0–40.0)

## 2013-12-20 LAB — COMPREHENSIVE METABOLIC PANEL
ALBUMIN: 4.3 g/dL (ref 3.5–5.2)
ALT: 28 U/L (ref 0–53)
AST: 26 U/L (ref 0–37)
Alkaline Phosphatase: 67 U/L (ref 39–117)
BUN: 18 mg/dL (ref 6–23)
CALCIUM: 9.1 mg/dL (ref 8.4–10.5)
CHLORIDE: 105 meq/L (ref 96–112)
CO2: 27 meq/L (ref 19–32)
Creatinine, Ser: 1.1 mg/dL (ref 0.4–1.5)
GFR: 72.74 mL/min (ref >60.00)
GLUCOSE: 107 mg/dL — AB (ref 70–99)
Potassium: 4.2 mEq/L (ref 3.5–5.1)
SODIUM: 137 meq/L (ref 135–145)
TOTAL PROTEIN: 7 g/dL (ref 6.0–8.3)
Total Bilirubin: 0.6 mg/dL (ref 0.3–1.2)

## 2013-12-20 LAB — CBC
HEMATOCRIT: 46 % (ref 39.0–52.0)
HEMOGLOBIN: 15.4 g/dL (ref 13.0–17.0)
MCHC: 33.5 g/dL (ref 30.0–36.0)
MCV: 93.5 fl (ref 78.0–100.0)
Platelets: 226 10*3/uL (ref 150.0–400.0)
RBC: 4.92 Mil/uL (ref 4.22–5.81)
RDW: 13 % (ref 11.5–14.6)
WBC: 7.4 10*3/uL (ref 4.5–10.5)

## 2013-12-20 LAB — TSH: TSH: 4.03 u[IU]/mL (ref 0.35–5.50)

## 2013-12-27 ENCOUNTER — Encounter: Payer: Self-pay | Admitting: Family Medicine

## 2013-12-27 ENCOUNTER — Ambulatory Visit (INDEPENDENT_AMBULATORY_CARE_PROVIDER_SITE_OTHER): Payer: 59 | Admitting: Family Medicine

## 2013-12-27 VITALS — BP 142/82 | HR 123 | Temp 98.2°F | Ht 68.25 in | Wt 168.0 lb

## 2013-12-27 DIAGNOSIS — R39198 Other difficulties with micturition: Secondary | ICD-10-CM

## 2013-12-27 DIAGNOSIS — I10 Essential (primary) hypertension: Secondary | ICD-10-CM

## 2013-12-27 DIAGNOSIS — Z86010 Personal history of colonic polyps: Secondary | ICD-10-CM

## 2013-12-27 DIAGNOSIS — Z1211 Encounter for screening for malignant neoplasm of colon: Secondary | ICD-10-CM

## 2013-12-27 DIAGNOSIS — Z720 Tobacco use: Secondary | ICD-10-CM

## 2013-12-27 DIAGNOSIS — N529 Male erectile dysfunction, unspecified: Secondary | ICD-10-CM

## 2013-12-27 DIAGNOSIS — F17200 Nicotine dependence, unspecified, uncomplicated: Secondary | ICD-10-CM

## 2013-12-27 DIAGNOSIS — R3912 Poor urinary stream: Secondary | ICD-10-CM

## 2013-12-27 DIAGNOSIS — Z0000 Encounter for general adult medical examination without abnormal findings: Secondary | ICD-10-CM

## 2013-12-27 LAB — PSA: PSA: 3.93 ng/mL (ref 0.10–4.00)

## 2013-12-27 NOTE — Progress Notes (Signed)
Subjective:    Patient ID: Derek Flowers, male    DOB: 25-May-1956, 58 y.o.   MRN: [id number]  HPI  58 yo pleasant male here for CPX.  Unfortunately, his mother died a few months ago.  He has a very supportive family and he feels he is coping well.  He is very spiritual and feels she is in a better place.  Fractured his leg/ankle (left) in October 2014.  S/p surgical repair. Doing PT.  Ready to go back to work soon.  HTN-  Lisinopril 10 mg daily. Denies any side effects. No CP or SOB.  Has had a little more weak urinary stream and increased urinary frequency past 6 months or so.  Lab Results  Component Value Date   CHOL 161 12/20/2013   HDL 37.30* 12/20/2013   LDLCALC 98 12/20/2013   TRIG 131.0 12/20/2013   CHOLHDL 4 12/20/2013   Lab Results  Component Value Date   CREATININE 1.1 12/20/2013   Lab Results  Component Value Date   PSA 2.95 09/16/2012    He is a smoker- smokes 1 ppd, sometimes more depending on stress level at work.  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  Very active job- lifts heavy machinery, walks.   Patient Active Problem List   Diagnosis Date Noted  . Personal history of colonic polyps 08/30/2013  . Erectile dysfunction 01/06/2013  . HTN (hypertension) 09/16/2012  . Tobacco abuse    Past Medical History  Diagnosis Date  . Tobacco abuse   . Colon polyps 11/2012   Past Surgical History  Procedure Laterality Date  . Tonsillectomy    . Hernia repair  1972  . Vasectomy  1983   History  Substance Use Topics  . Smoking status: Current Every Day Smoker -- 0.50 packs/day for 40 years    Types: Cigarettes  . Smokeless tobacco: Never Used  . Alcohol Use: No   Family History  Problem Relation Age of Onset  . Cancer Mother   . Kidney disease Mother   . Cancer Father   . Kidney disease Father   . Heart disease Father   . Colon polyps Father    Allergies  Allergen Reactions  . Penicillins     As a child   Current Outpatient Prescriptions on File  Prior to Visit  Medication Sig Dispense Refill  . aspirin 325 MG tablet Take 325 mg by mouth daily.      Marland Kitchen lisinopril (PRINIVIL,ZESTRIL) 10 MG tablet TAKE 1 TABLET BY MOUTH EVERY DAY  90 tablet  0  . Multiple Vitamin (MULTIVITAMIN) tablet Take 1 tablet by mouth daily.      . naproxen sodium (ANAPROX) 220 MG tablet Take 220 mg by mouth 2 (two) times daily with a meal.      . Polyethylene Glycol 3350 (MIRALAX PO) Take by mouth. Has taken two doses.      . sildenafil (VIAGRA) 100 MG tablet Take 0.5-1 tablets (50-100 mg total) by mouth daily as needed for erectile dysfunction.  10 tablet  11   No current facility-administered medications on file prior to visit.   The PMH, PSH, Social History, Family History, Medications, and allergies have been reviewed in [facility], and have been updated if relevant.   Review of Systems See HPI Patient reports no  vision/ hearing changes,anorexia, weight change, fever ,adenopathy, persistant / recurrent hoarseness, swallowing issues, chest pain, edema,persistant / recurrent cough, hemoptysis, dyspnea(rest, exertional, paroxysmal nocturnal), gastrointestinal  bleeding (melena, rectal bleeding), abdominal pain, excessive  heart burn,  focal weakness, severe memory loss, concerning skin lesions, depression, anxiety, abnormal bruising/bleeding, major joint swelling.       Objective:   Physical Exam BP 142/82  Pulse 123  Temp(Src) 98.2 F (36.8 C) (Oral)  Ht 5' 8.25" (1.734 m)  Wt 168 lb (76.204 kg)  BMI 25.34 kg/m2  SpO2 97% General:  Very pleasant but anxious male in NAD Eyes:  PERRL Ears:  External ear exam shows no significant lesions or deformities.  Otoscopic examination reveals clear canals, tympanic membranes are intact bilaterally without bulging, retraction, inflammation or discharge. Hearing is grossly normal bilaterally. Nose:  External nasal examination shows no deformity or inflammation. Nasal mucosa are pink and moist without lesions or  exudates. Mouth:  Oral mucosa and oropharynx without lesions or exudates.  Teeth in good repair. Neck:  no carotid bruit or thyromegaly no cervical or supraclavicular lymphadenopathy  Lungs:  Normal respiratory effort, chest expands symmetrically. Lungs are clear to auscultation, no crackles or wheezes. Heart:  Normal rate and regular rhythm. S1 and S2 normal without gallop, murmur, click, rub or other extra sounds. Abdomen:  Bowel sounds positive,abdomen soft and non-tender without masses, organomegaly or hernias noted. Genitalia:  Testes bilaterally descended without nodularity, tenderness or masses. No scrotal masses or lesions. No penis lesions or urethral discharge. Prostate:  Prostate gland firm and smooth, 1 plus enlargement, no nodularity, tenderness, mass, asymmetry or induration. Pulses:  R and L posterior tibial pulses are full and equal bilaterally  Extremities:  no edema      Assessment & Plan:

## 2013-12-27 NOTE — Progress Notes (Signed)
Pre-visit discussion using our clinic review tool. No additional management support is needed unless otherwise documented below in the visit note.  

## 2013-12-27 NOTE — Assessment & Plan Note (Signed)
Reviewed preventive care protocols, scheduled due services, and updated immunizations Discussed nutrition, exercise, diet, and healthy lifestyle.  

## 2013-12-27 NOTE — Patient Instructions (Signed)
Good to see you. I will call you with your lab result from today.  Tamsulosin capsules What is this medicine? TAMSULOSIN (tam SOO loe sin) is used to treat enlargement of the prostate gland in men, a condition called benign prostatic hyperplasia or BPH. It is not for use in women. It works by relaxing muscles in the prostate and bladder neck. This improves urine flow and reduces BPH symptoms. This medicine may be used for other purposes; ask your health care provider or pharmacist if you have questions. COMMON BRAND NAME(S): Flomax What should I tell my health care provider before I take this medicine? They need to know if you have any of the following conditions: -advanced kidney disease -advanced liver disease -low blood pressure -prostate cancer -an unusual or allergic reaction to tamsulosin, sulfa drugs, other medicines, foods, dyes, or preservatives -pregnant or trying to get pregnant -breast-feeding How should I use this medicine? Take this medicine by mouth about 30 minutes after the same meal every day. Follow the directions on the prescription label. Swallow the capsules whole with a glass of water. Do not crush, chew, or open capsules. Do not take your medicine more often than directed. Do not stop taking your medicine unless your doctor tells you to. Talk to your pediatrician regarding the use of this medicine in children. Special care may be needed. Overdosage: If you think you have taken too much of this medicine contact a poison control center or emergency room at once. NOTE: This medicine is only for you. Do not share this medicine with others. What if I miss a dose? If you miss a dose, take it as soon as you can. If it is almost time for your next dose, take only that dose. Do not take double or extra doses. If you stop taking your medicine for several days or more, ask your doctor or health care professional what dose you should start back on. What may interact with this  medicine? -cimetidine -fluoxetine -ketoconazole -medicines for erectile disfunction like sildenafil, tadalafil, vardenafil -medicines for high blood pressure -other alpha-blockers like alfuzosin, doxazosin, phentolamine, phenoxybenzamine, prazosin, terazosin -warfarin This list may not describe all possible interactions. Give your health care provider a list of all the medicines, herbs, non-prescription drugs, or dietary supplements you use. Also tell them if you smoke, drink alcohol, or use illegal drugs. Some items may interact with your medicine. What should I watch for while using this medicine? Visit your doctor or health care professional for regular check ups. You will need lab work done before you start this medicine and regularly while you are taking it. Check your blood pressure as directed. Ask your health care professional what your blood pressure should be, and when you should contact him or her. This medicine may make you feel dizzy or lightheaded. This is more likely to happen after the first dose, after an increase in dose, or during hot weather or exercise. Drinking alcohol and taking some medicines can make this worse. Do not drive, use machinery, or do anything that needs mental alertness until you know how this medicine affects you. Do not sit or stand up quickly. If you begin to feel dizzy, sit down until you feel better. These effects can decrease once your body adjusts to the medicine. Contact your doctor or health care professional right away if you have an erection that lasts longer than 4 hours or if it becomes painful. This may be a sign of a serious problem and must  be treated right away to prevent permanent damage. If you are thinking of having cataract surgery, tell your eye surgeon that you have taken this medicine. What side effects may I notice from receiving this medicine? Side effects that you should report to your doctor or health care professional as soon as  possible: -allergic reactions like skin rash or itching, hives, swelling of the lips, mouth, tongue, or throat -breathing problems -change in vision -feeling faint or lightheaded -irregular heartbeat -prolonged or painful erection -weakness Side effects that usually do not require medical attention (report to your doctor or health care professional if they continue or are bothersome): -back pain -change in sex drive or performance -constipation, nausea or vomiting -cough -drowsy -runny or stuffy nose -trouble sleeping This list may not describe all possible side effects. Call your doctor for medical advice about side effects. You may report side effects to FDA at 1-800-FDA-1088. Where should I keep my medicine? Keep out of the reach of children. Store at room temperature between 15 and 30 degrees C (59 and 86 degrees F). Throw away any unused medicine after the expiration date. NOTE: This sheet is a summary. It may not cover all possible information. If you have questions about this medicine, talk to your doctor, pharmacist, or health care provider.  2014, Elsevier/Gold Standard. (2012-12-02 14:11:34)

## 2013-12-27 NOTE — Assessment & Plan Note (Signed)
Continue lisinopril

## 2013-12-27 NOTE — Assessment & Plan Note (Addendum)
PSA today No prostate nodules on exam Discussed flomax.

## 2013-12-28 ENCOUNTER — Other Ambulatory Visit: Payer: Self-pay | Admitting: Family Medicine

## 2013-12-28 ENCOUNTER — Telehealth: Payer: Self-pay | Admitting: Family Medicine

## 2013-12-28 MED ORDER — TAMSULOSIN HCL 0.4 MG PO CAPS: 0.4000 mg | ORAL_CAPSULE | Freq: Every day | ORAL | Status: DC

## 2013-12-28 NOTE — Telephone Encounter (Signed)
Relevant patient education assigned to patient using Emmi. ° °

## 2014-01-11 ENCOUNTER — Other Ambulatory Visit: Payer: Self-pay | Admitting: Internal Medicine

## 2014-02-22 ENCOUNTER — Other Ambulatory Visit: Payer: Self-pay | Admitting: Internal Medicine

## 2014-02-24 ENCOUNTER — Ambulatory Visit: Payer: Self-pay | Admitting: Orthopedic Surgery

## 2014-05-09 ENCOUNTER — Other Ambulatory Visit: Payer: Self-pay | Admitting: Family Medicine

## 2014-08-14 IMAGING — CT CT ABD-PELV W/O CM
2 of 4 series · 17 of 46 positions shown, 19 images · non-contrast
Comparison: None.

CLINICAL DATA: Left groin pain and microscopic hematuria

CT ABDOMEN AND PELVIS WITHOUT CONTRAST
TECHNIQUE: Multidetector CT imaging of the abdomen and pelvis was
performed following the standard protocol without intravenous
contrast.

[Series 2: 5mm stone study - 160 eff. mas · axial · 0.65mm/px · z∈[-490,-65]mm · 14 of 93 slices shown, 16 images]
[im 4/93  soft-tissue]
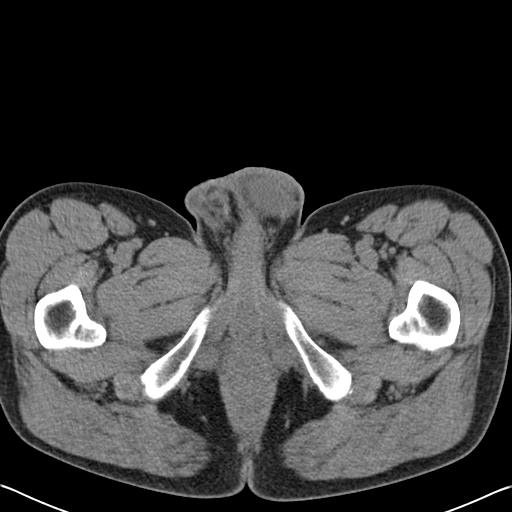
[im 4/93  bone]
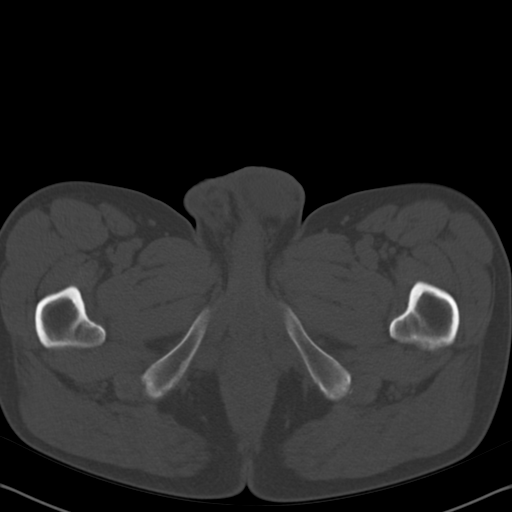
[im 12/93  soft-tissue]
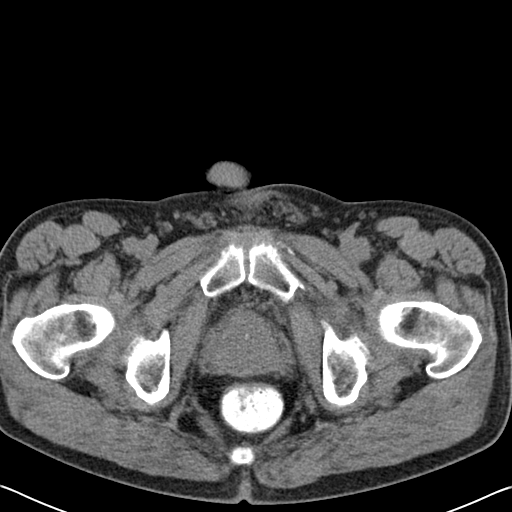
[im 19/93  soft-tissue]
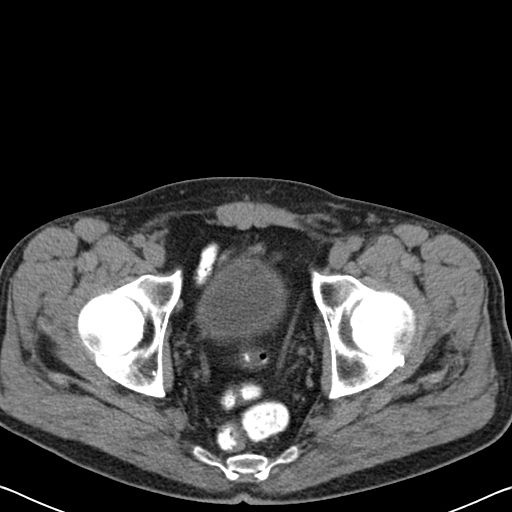
[im 26/93  soft-tissue]
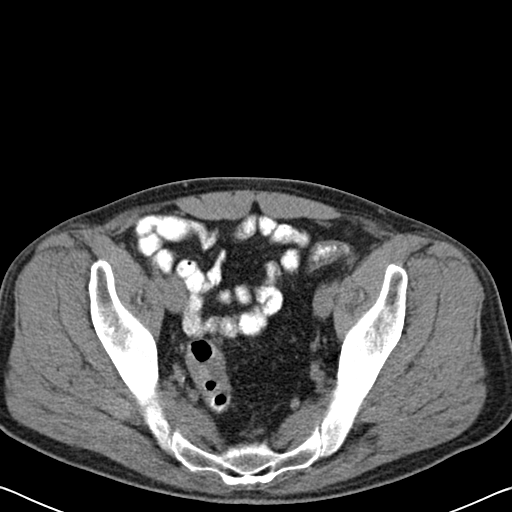
[im 30/93  soft-tissue]
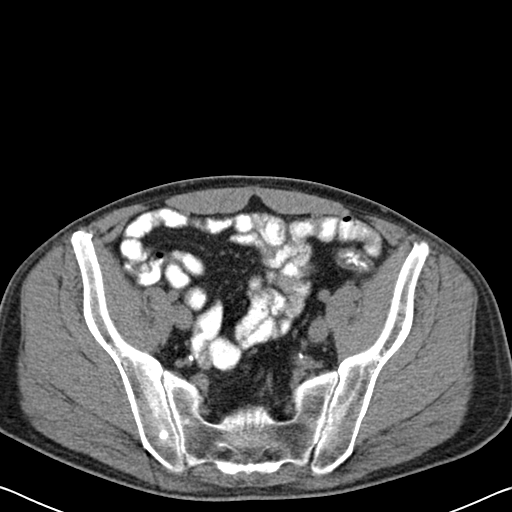
[im 37/93  soft-tissue]
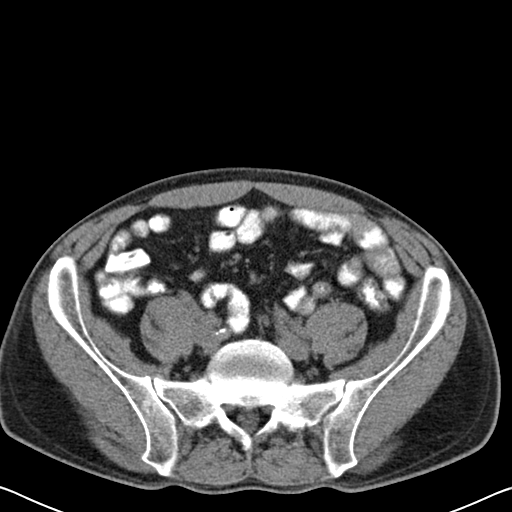
[im 45/93  soft-tissue]
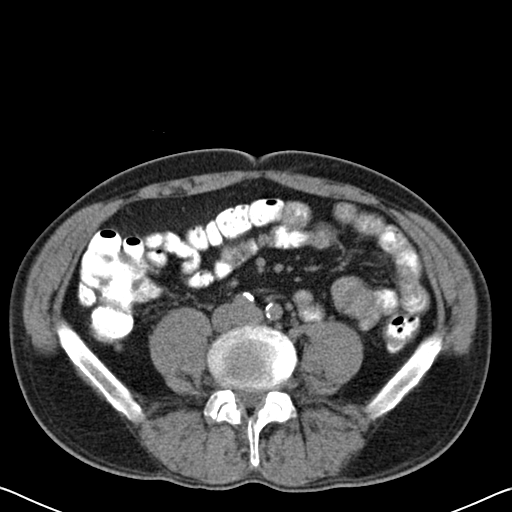
[im 48/93  soft-tissue]
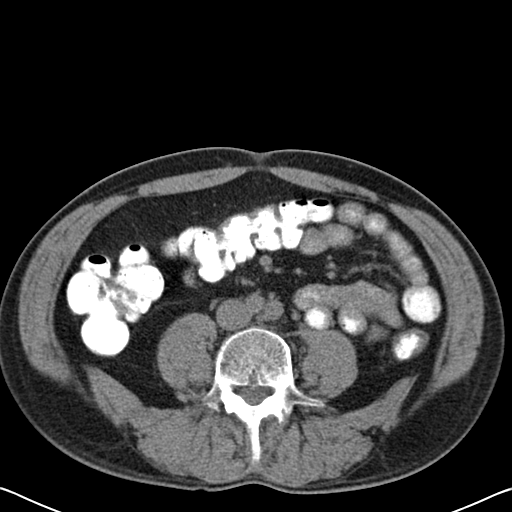
[im 56/93  soft-tissue]
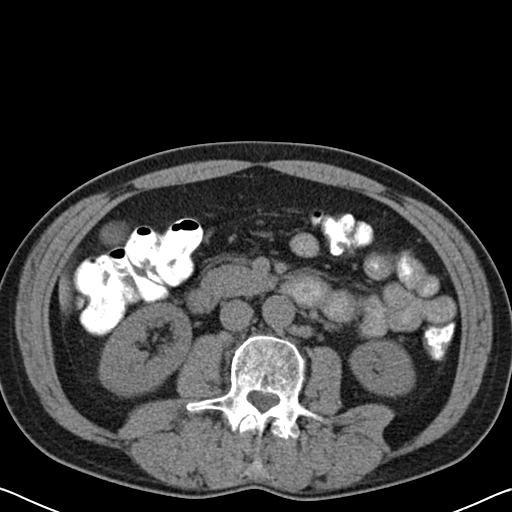
[im 56/93  bone]
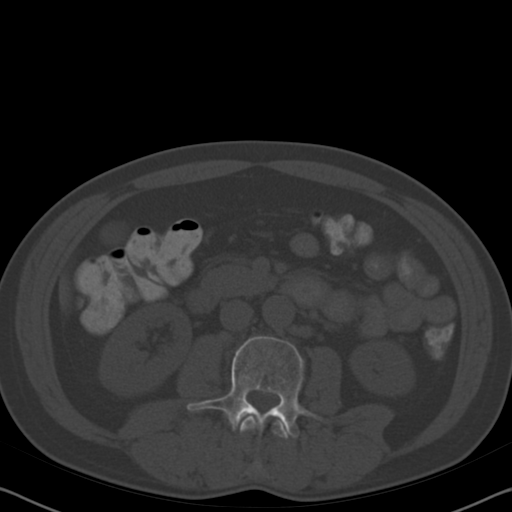
[im 63/93  soft-tissue]
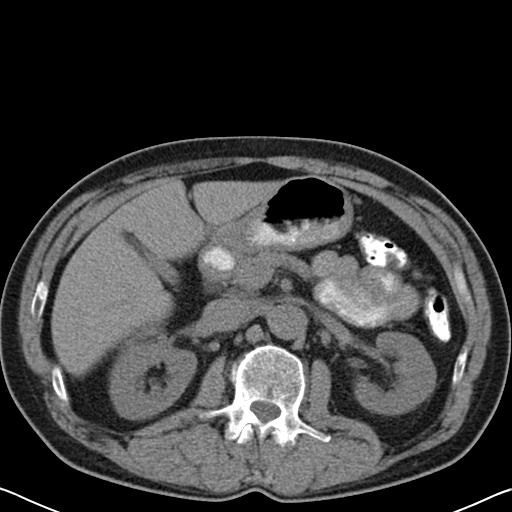
[im 70/93  soft-tissue]
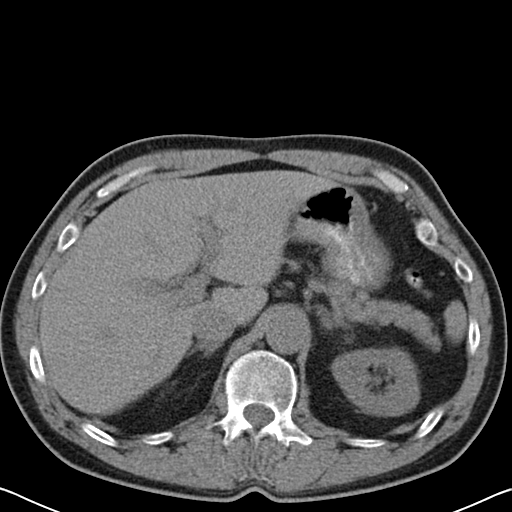
[im 74/93  soft-tissue]
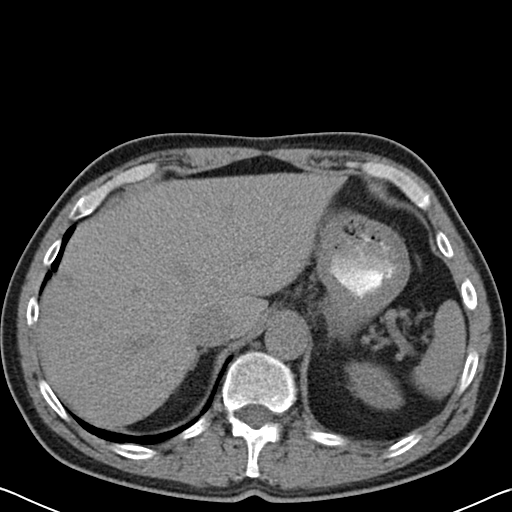
[im 81/93  soft-tissue]
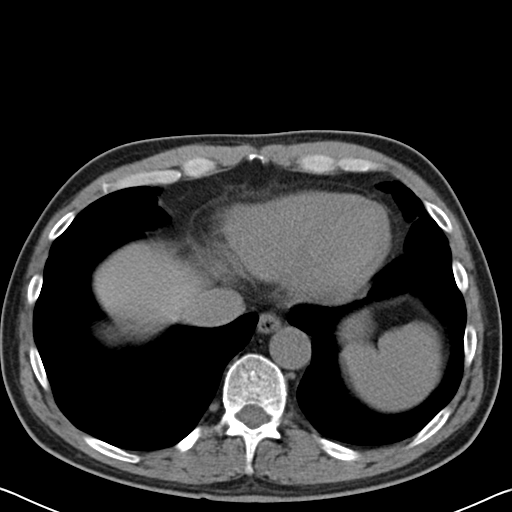
[im 89/93  soft-tissue]
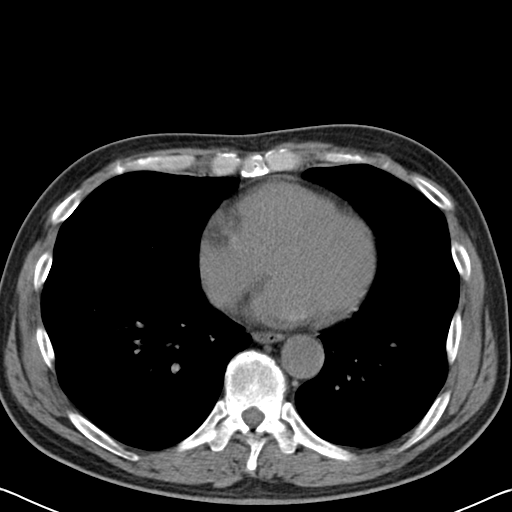

[Series 602: cor · coronal · 0.94mm/px · 3 of 113 slices shown]
[im 38/113  soft-tissue]
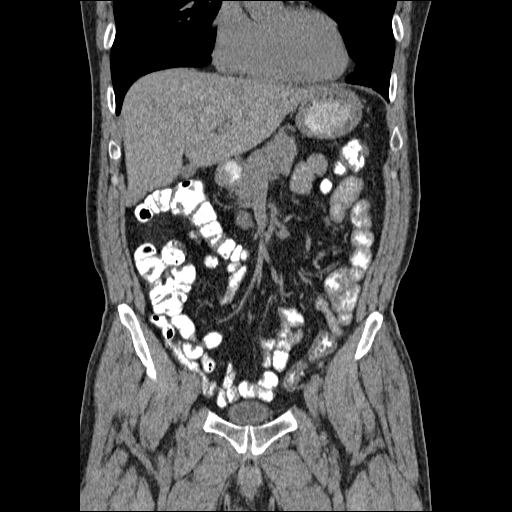
[im 50/113  soft-tissue]
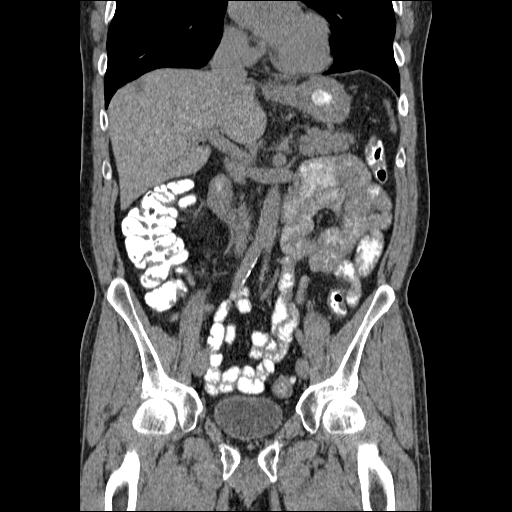
[im 63/113  soft-tissue]
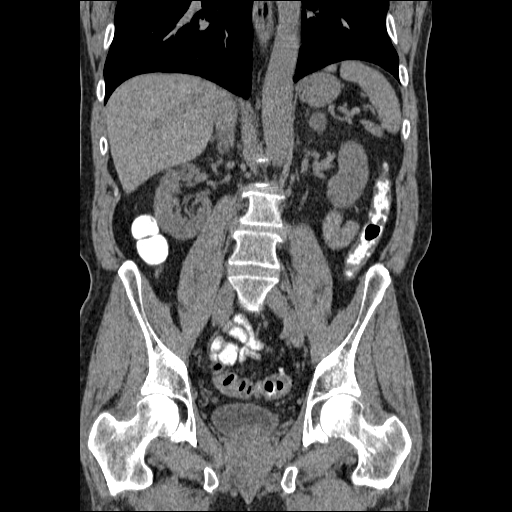

[17 of 46 positions shown; findings below may reference images not displayed]

FINDINGS: Lung bases are free of acute infiltrate or sizable
effusion.  Minimal atelectasis is noted in the lingula.

The liver, gallbladder, spleen, adrenal gland and pancreas are
within normal limits.  The left adrenal gland demonstrates an
exophytic nodule measuring 1.7 cm.  This statistically likely
represents an adenoma.

Kidneys are well visualized bilaterally.  A nonobstructing right
renal stone is seen which measures 5 mm.  No ureteral calculi are
seen. A circumaortic left renal vein is noted.

Aortic calcifications are noted without aneurysmal dilatation.
Diverticular change is noted within the colon.  The appendix is
within normal limits.
IMPRESSION: Diverticulosis without diverticulitis.  Some mild smooth muscle
thickening is seen.

Likely benign left adrenal nodule.

Nonobstructing right renal stone.

## 2014-08-30 ENCOUNTER — Other Ambulatory Visit: Payer: Self-pay | Admitting: Family Medicine

## 2014-08-31 ENCOUNTER — Other Ambulatory Visit: Payer: Self-pay | Admitting: Family Medicine

## 2014-09-19 IMAGING — CR DG OUTSIDE FILMS EXTREMITY
1 series · 6 of 6 positions shown · non-contrast
Comparison: none

[Series 1: ap · 0.17mm/px · 6 of 6 slices shown]
[im 1/6]
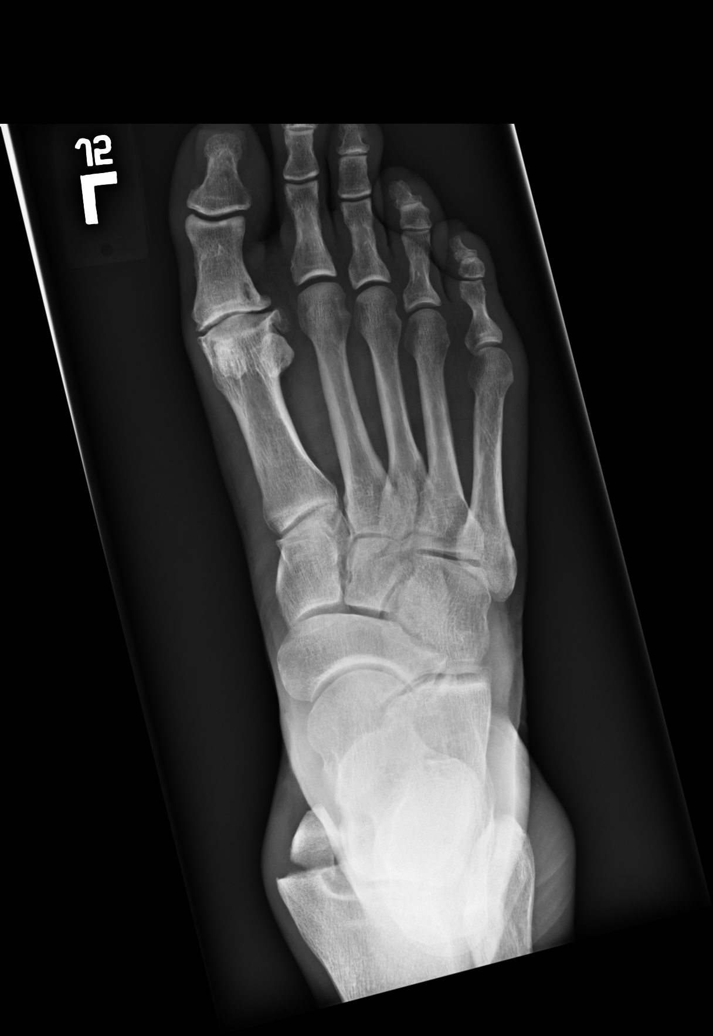
[im 2/6]
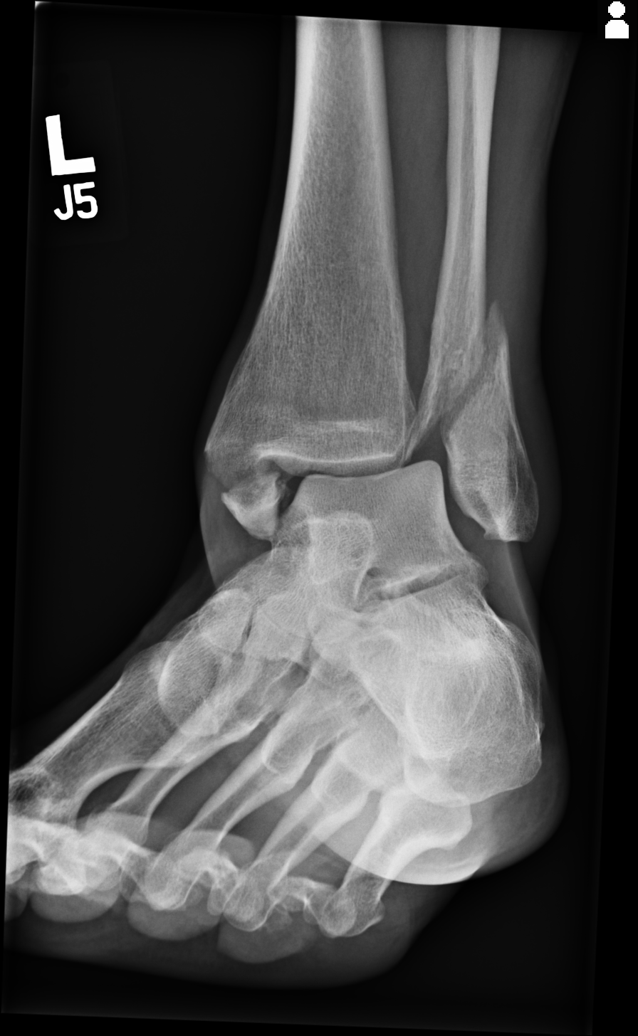
[im 3/6]
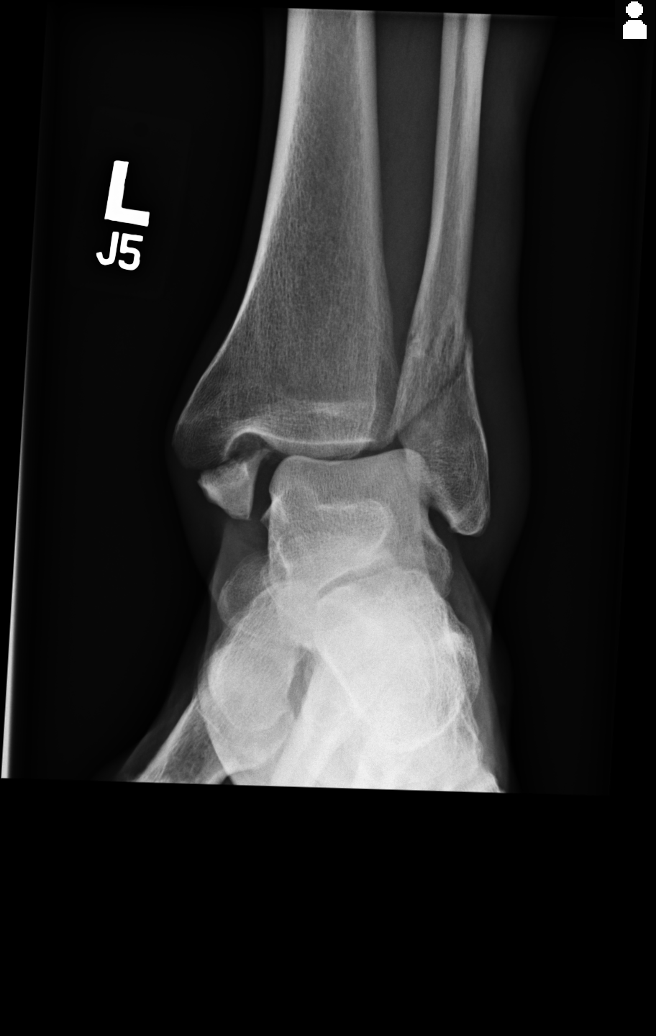
[im 4/6]
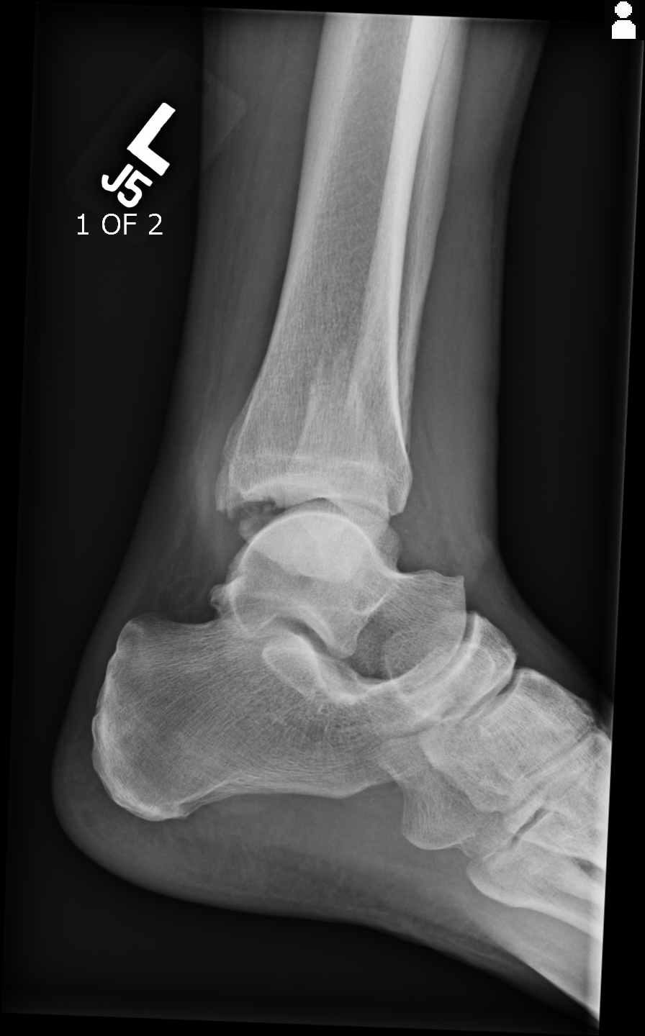
[im 5/6]
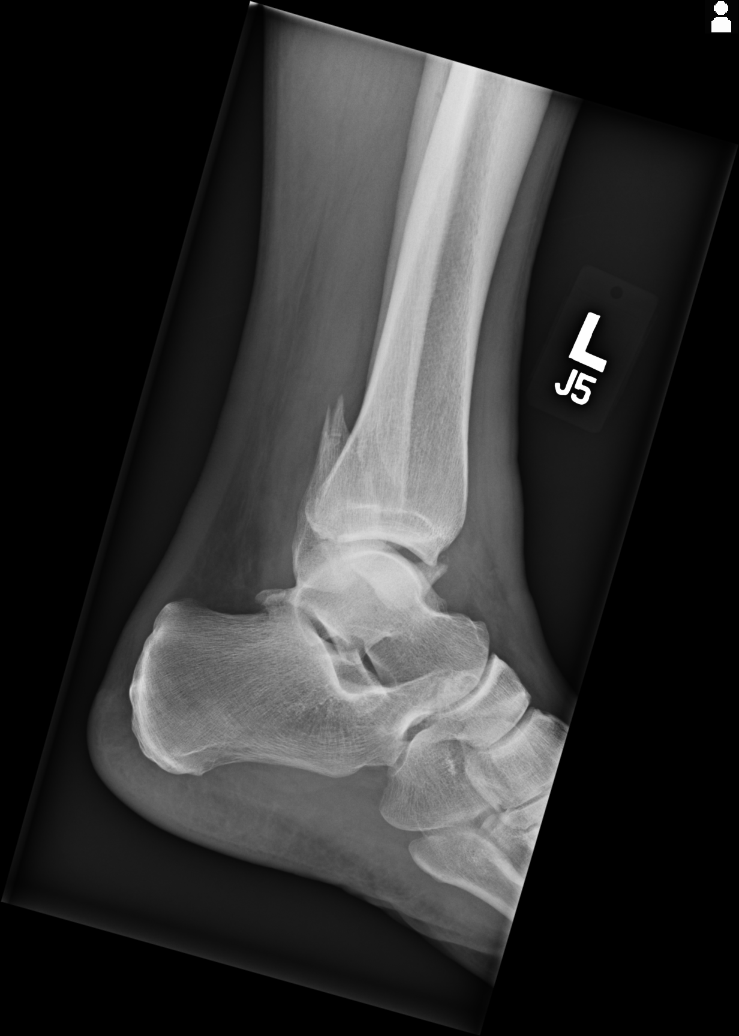
[im 6/6]
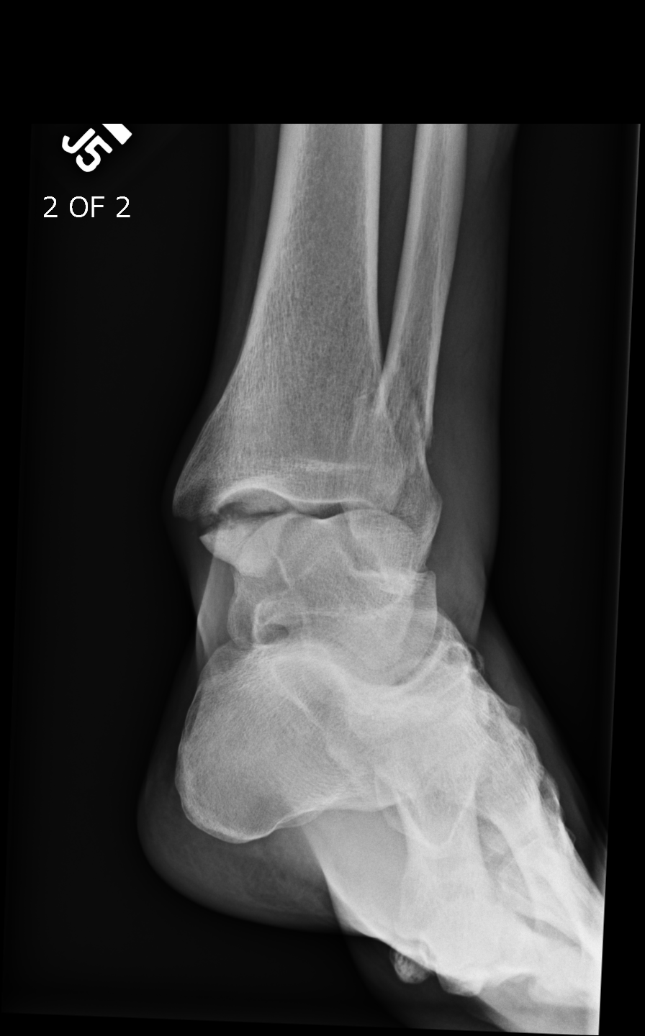

[6 of 6 positions shown; findings below may reference images not displayed]

Canned report from images found in remote index.

Refer to host system for actual result text.

## 2014-09-25 IMAGING — CR DG ANKLE 2V *L*
1 series · 2 of 2 positions shown · non-contrast
Comparison: none

REASON FOR EXAM: post op
COMMENTS:   LMP: (Male)

PROCEDURE:     DXR - DXR ANKLE LEFT AP AND LATERAL  - October 12, 2013  [DATE]
RESULT:     Left ankle images show ORIF hardware and skin staples medially
and laterally with casting material present as a splint. There is no
postoperative bone or hardware complication appreciated.

[Series 1: ap · 0.17mm/px · 2 of 2 slices shown]
[im 1/2]
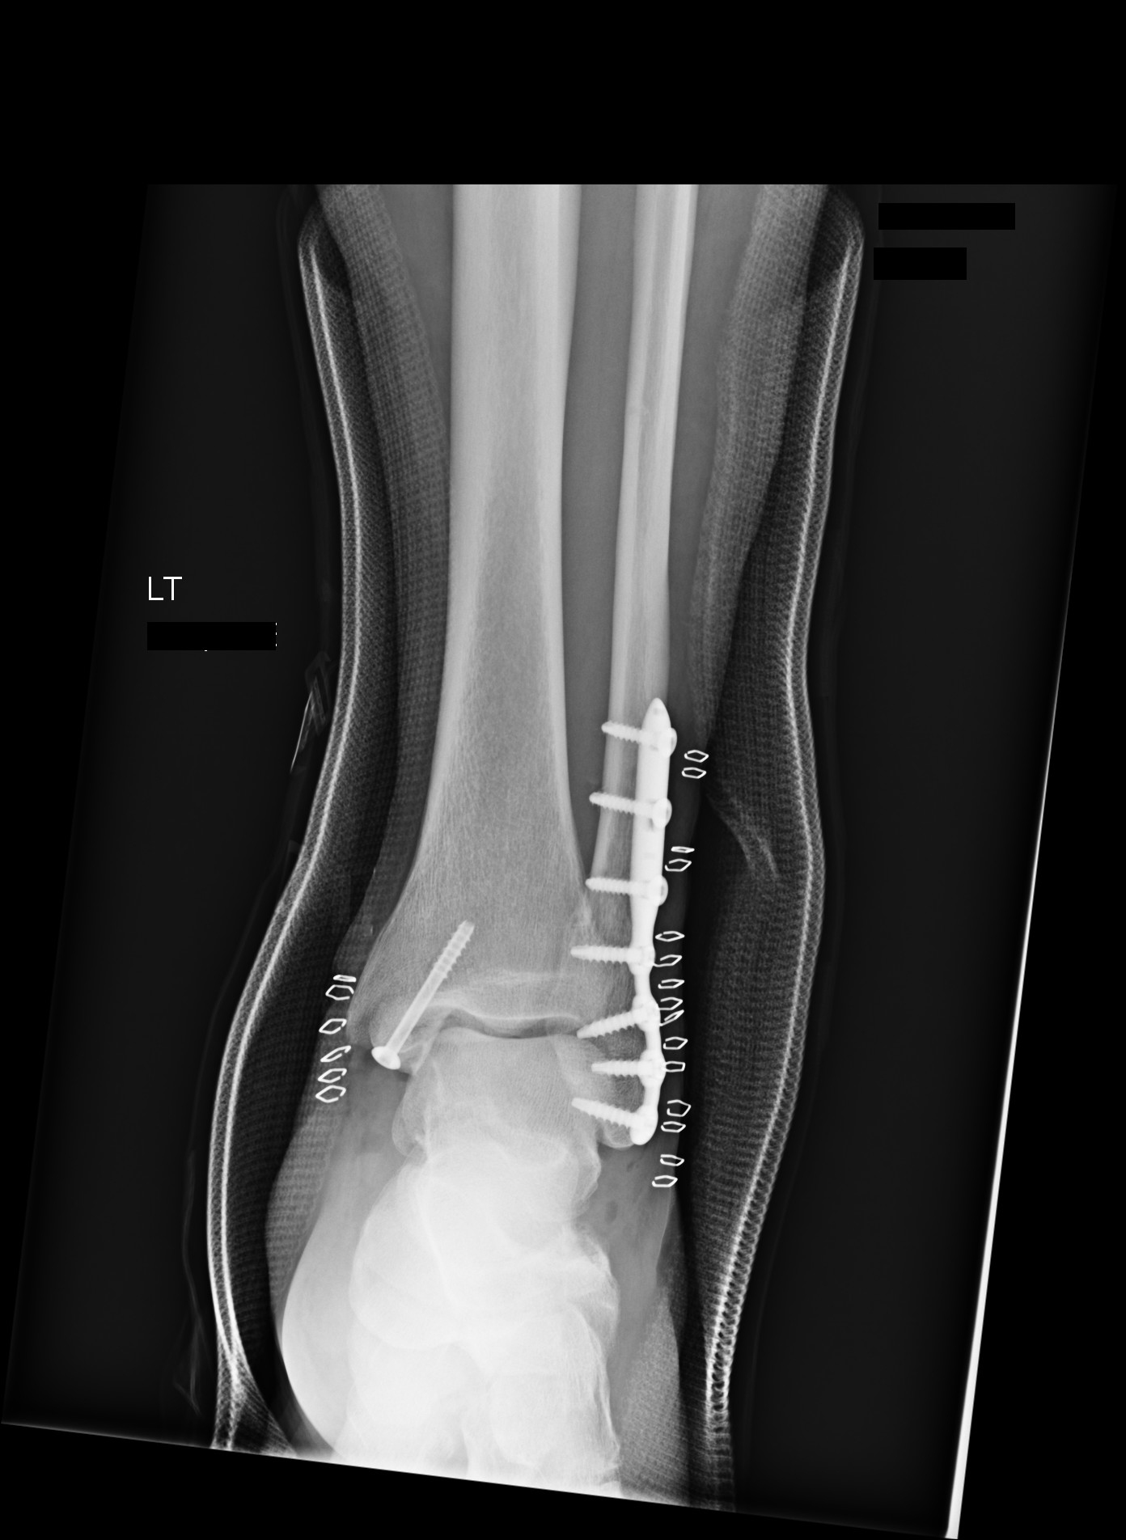
[im 2/2]
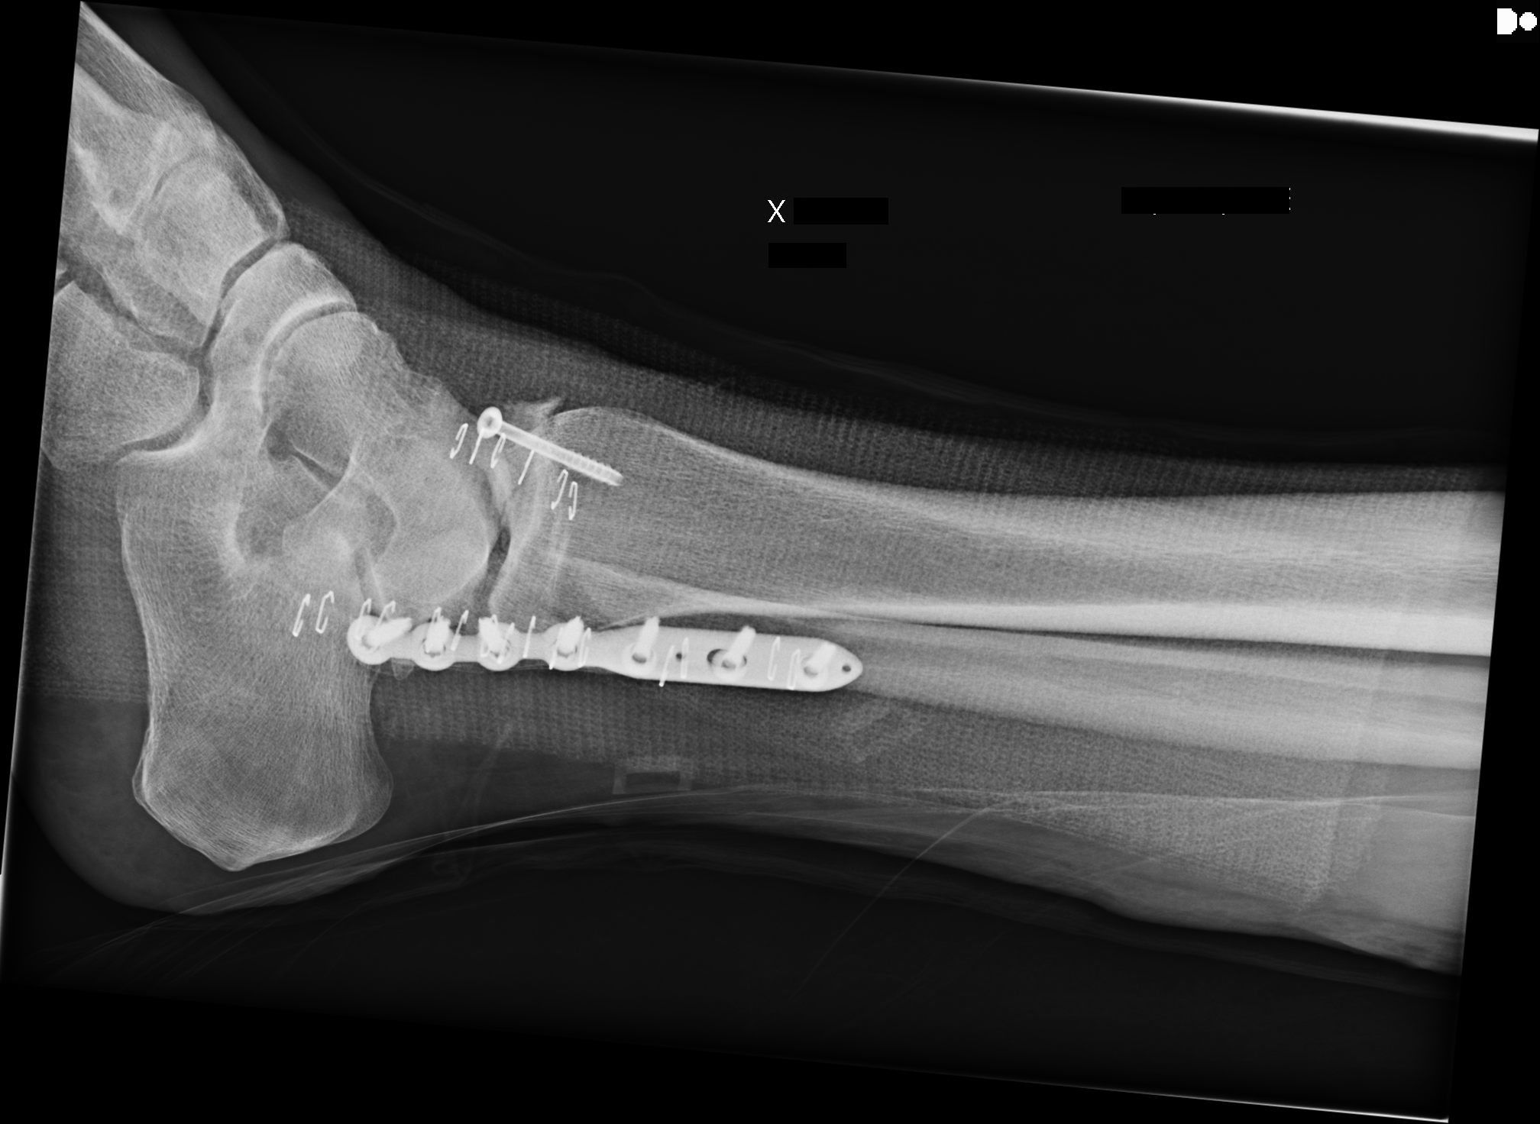

[2 of 2 positions shown; findings below may reference images not displayed]

IMPRESSION: Please see above.

[REDACTED]

## 2015-04-07 NOTE — Op Note (Signed)
PATIENT NAME:  Derek Flowers, Derek Flowers MR#:  [id number] DATE OF BIRTH:  June 29, 1956  DATE OF PROCEDURE:  10/06/2013  PREOPERATIVE DIAGNOSIS: Left bimalleolar fracture, displaced.   POSTOPERATIVE DIAGNOSIS: Left bimalleolar fracture, displaced.  PROCEDURE: Closed reduction.   ANESTHESIA: None.   DESCRIPTION OF PROCEDURE: The patient was seen in the Emergency Room. He had lateral displacement of the distal fibula and medial malleolus. His skin had some pressure applied to it. The fracture was reduced by supinating the foot. There was significant improvement in alignment and lack of pressure on the skin. A well-padded stirrup splint was applied, and the splint was molded until it was set with an Ace wrap overlying it. The patient was discharged home with plans of ORIF next week. He is to follow up with me on October 27th for H and P.   ____________________________ Laurene Footman, MD mjm:gb D: 10/07/2013 05:12:25 ET T: 10/07/2013 05:46:00 ET JOB#: [id number]  cc: Laurene Footman, MD, <Dictator> Laurene Footman MD ELECTRONICALLY SIGNED 10/07/2013 7:29

## 2015-04-07 NOTE — Op Note (Signed)
PATIENT NAME:  Derek Flowers, Derek Flowers MR#:  [id number] DATE OF BIRTH:  June 01, 1956  DATE OF PROCEDURE:  10/12/2013  PREOPERATIVE DIAGNOSIS: Bimalleolar ankle fracture.   POSTOPERATIVE DIAGNOSIS: Bimalleolar ankle fracture.   PROCEDURE: Open reduction and internal fixation, medial and lateral malleoli.   ANESTHESIA: Spinal.   SURGEON: Hessie Knows, M.D.   DESCRIPTION OF PROCEDURE: The patient was brought to the operating room and after adequate spinal anesthesia was obtained, a tourniquet was applied to the upper thigh with a bump underneath the buttock to internally rotate the leg. After prepping and draping in the usual sterile fashion, appropriate patient identification, timeout procedures were completed, the tourniquet was raised to 300 mmHg.   An incision was made over the distal fibula with exposure of the lateral malleolus. A 7-hole composite locking plate from the [employer] small fragment tray was contoured to fit the distal fibula and slid across the fracture site into the more proximal fibula. AP and lateral images showed a well-aligned fracture. The distal portion was fixed first with a nonlocking screw to bring the plate directly down to the fibula proximally. Three cortical screws were placed through a percutaneous technique, and the remaining locking cancellus screws were placed and the distal screws with 4 screw holes being filled. The first nonlocking screw removed to allow for placement of the locking screw. The fibula appeared to be anatomically aligned.   Incision was then made anteromedially and the fracture site exposed with periosteum removed from the fracture site. The joint was irrigated. There were no loose fragments noted. With the fracture held in a reduced position with the dental pick instrument, a guidewire was inserted through the tip of the malleolus into the metaphysis of the tibia. This was overdrilled and a 35 mm partially threaded cancellus screw was inserted with good  compression at the fracture site. The wounds were irrigated and then closed with 2-0 Vicryl subcutaneously and skin staples. Xeroform, 4 x 4's, Webril and a stirrup splint applied along with an Ace wrap.   TOURNIQUET TIME: 54 minutes at 300 mmHg.   COMPLICATIONS: No complications.   SPECIMEN: No specimen.   IMPLANTS: From the [employer] small fragment tray composite locking plate on the distal fibula with multiple screws, 4.0 cannulated screw medially for a smaller medial malleolar fragment.    ____________________________ Laurene Footman, MD mjm:np D: 10/12/2013 18:19:03 ET T: 10/12/2013 19:10:29 ET JOB#: [id number]  cc: Laurene Footman, MD, <Dictator> Laurene Footman MD ELECTRONICALLY SIGNED 10/12/2013 20:54

## 2015-04-08 NOTE — Op Note (Signed)
PATIENT NAME:  Derek Flowers, Derek Flowers MR#:  [id number] DATE OF BIRTH:  09/23/1956  DATE OF PROCEDURE:  02/24/2014  PREOPERATIVE DIAGNOSIS:  Painful hardware, left ankle medial and lateral.   POSTOPERATIVE DIAGNOSIS:  Painful hardware, left ankle medial and lateral.  PROCEDURE:  Removal of deep hardware, left ankle.   ANESTHESIA:  General.   SURGEON:  Hessie Knows, M.D.   DESCRIPTION OF PROCEDURE:  The patient was brought to the Operating Room and after adequate anesthesia was obtained, the left leg was prepped and draped in the usual sterile fashion with a bump underneath the left buttock.  After having prepped and draped the leg, a timeout procedure was completed.  The tourniquet was raised to 300 mmHg.  The lateral incision was opened first with the prior incisions opened with plate exposed.  The screws were removed without difficulty.  There was quite a bit of bursa overlying the plate.  After removal of all screws, the wound was thoroughly irrigated and closed with 3-0 Vicryl and staples.  Going medially, the distal portion of the prior medial incision was opened, the head of the screw identified and the screw removed without difficulty with it being a 4-0 cannulated screw.  At this point after thorough irrigation of the medial wound it was also closed using skin staples.  Xeroform, 4 x 4's, Webril and Ace wrap applied and the patient was sent to the recovery room in stable condition.   ESTIMATED BLOOD LOSS:  Minimal.   COMPLICATIONS:  None.   SPECIMEN:  None.  Hardware sent with the patient.    ____________________________ Laurene Footman, MD mjm:ea D: 02/24/2014 18:36:32 ET T: 02/25/2014 06:06:19 ET JOB#: [id number]  cc: Laurene Footman, MD, <Dictator> Laurene Footman MD ELECTRONICALLY SIGNED 02/25/2014 10:58

## 2017-10-20 ENCOUNTER — Encounter: Payer: Self-pay | Admitting: *Deleted

## 2017-12-19 ENCOUNTER — Encounter: Payer: Self-pay | Admitting: Internal Medicine

## 2019-04-29 ENCOUNTER — Telehealth: Payer: Self-pay | Admitting: Family Medicine

## 2019-04-29 NOTE — Telephone Encounter (Signed)
Called to see if pt is still seeing Dr Deborra Medina as PCP since it has been a while, if not we need to update and if he is we need to schedule an appointment
